# Patient Record
Sex: Female | Born: 1970 | Race: White | Hispanic: No | Marital: Married | Smoking: Never smoker
Health system: Southern US, Community
[De-identification: ages and names within clinical notes are randomized; demographics above are authoritative.]

## PROBLEM LIST (undated history)

## (undated) DIAGNOSIS — Z1501 Genetic susceptibility to malignant neoplasm of breast: Secondary | ICD-10-CM

## (undated) HISTORY — PX: DIAGNOSTIC LAPAROSCOPY: SUR761

## (undated) HISTORY — PX: LAPAROSCOPIC ENDOMETRIOSIS FULGURATION: SUR769

## (undated) HISTORY — DX: Genetic susceptibility to malignant neoplasm of breast: Z15.01

## (undated) HISTORY — PX: TUBAL LIGATION: SHX77

---

## 1997-11-25 ENCOUNTER — Other Ambulatory Visit: Admission: RE | Admit: 1997-11-25 | Discharge: 1997-11-25 | Payer: Self-pay | Admitting: Obstetrics and Gynecology

## 1998-05-05 ENCOUNTER — Other Ambulatory Visit: Admission: RE | Admit: 1998-05-05 | Discharge: 1998-05-05 | Payer: Self-pay | Admitting: Obstetrics and Gynecology

## 1998-11-14 ENCOUNTER — Inpatient Hospital Stay (HOSPITAL_COMMUNITY): Admission: AD | Admit: 1998-11-14 | Discharge: 1998-11-14 | Payer: Self-pay | Admitting: Obstetrics and Gynecology

## 1999-01-25 ENCOUNTER — Other Ambulatory Visit: Admission: RE | Admit: 1999-01-25 | Discharge: 1999-01-25 | Payer: Self-pay | Admitting: Obstetrics and Gynecology

## 1999-10-18 ENCOUNTER — Other Ambulatory Visit: Admission: RE | Admit: 1999-10-18 | Discharge: 1999-10-18 | Payer: Self-pay | Admitting: Obstetrics and Gynecology

## 2000-05-04 ENCOUNTER — Inpatient Hospital Stay (HOSPITAL_COMMUNITY): Admission: AD | Admit: 2000-05-04 | Discharge: 2000-05-06 | Payer: Self-pay | Admitting: Obstetrics and Gynecology

## 2000-06-04 ENCOUNTER — Other Ambulatory Visit: Admission: RE | Admit: 2000-06-04 | Discharge: 2000-06-04 | Payer: Self-pay | Admitting: Obstetrics and Gynecology

## 2001-01-18 ENCOUNTER — Other Ambulatory Visit: Admission: RE | Admit: 2001-01-18 | Discharge: 2001-01-18 | Payer: Self-pay | Admitting: Obstetrics and Gynecology

## 2001-09-04 ENCOUNTER — Other Ambulatory Visit: Admission: RE | Admit: 2001-09-04 | Discharge: 2001-09-04 | Payer: Self-pay | Admitting: Obstetrics and Gynecology

## 2002-07-08 ENCOUNTER — Inpatient Hospital Stay (HOSPITAL_COMMUNITY): Admission: AD | Admit: 2002-07-08 | Discharge: 2002-07-10 | Payer: Self-pay | Admitting: Obstetrics and Gynecology

## 2002-08-26 ENCOUNTER — Other Ambulatory Visit: Admission: RE | Admit: 2002-08-26 | Discharge: 2002-08-26 | Payer: Self-pay | Admitting: Obstetrics and Gynecology

## 2003-05-15 ENCOUNTER — Other Ambulatory Visit: Admission: RE | Admit: 2003-05-15 | Discharge: 2003-05-15 | Payer: Self-pay | Admitting: Obstetrics and Gynecology

## 2004-03-15 ENCOUNTER — Ambulatory Visit: Payer: Self-pay | Admitting: Family Medicine

## 2005-03-30 ENCOUNTER — Ambulatory Visit: Payer: Self-pay | Admitting: Family Medicine

## 2006-04-18 ENCOUNTER — Ambulatory Visit: Payer: Self-pay | Admitting: Family Medicine

## 2006-05-02 ENCOUNTER — Ambulatory Visit: Payer: Self-pay | Admitting: Family Medicine

## 2006-05-02 LAB — CONVERTED CEMR LAB
ALT: 30 units/L (ref 0–40)
AST: 21 units/L (ref 0–37)
Alkaline Phosphatase: 75 units/L (ref 39–117)
BUN: 8 mg/dL (ref 6–23)
Basophils Relative: 0.3 % (ref 0.0–1.0)
CO2: 30 meq/L (ref 19–32)
Chloride: 108 meq/L (ref 96–112)
Creatinine, Ser: 0.6 mg/dL (ref 0.4–1.2)
Eosinophils Relative: 1.1 % (ref 0.0–5.0)
Glucose, Bld: 77 mg/dL (ref 70–99)
Lymphocytes Relative: 25.9 % (ref 12.0–46.0)
Monocytes Absolute: 0.5 10*3/uL (ref 0.2–0.7)
Monocytes Relative: 8.5 % (ref 3.0–11.0)
Platelets: 330 10*3/uL (ref 150–400)
Potassium: 4.3 meq/L (ref 3.5–5.1)
RDW: 11.1 % — ABNORMAL LOW (ref 11.5–14.6)
TSH: 1.11 microintl units/mL (ref 0.35–5.50)
Total Bilirubin: 0.6 mg/dL (ref 0.3–1.2)
Total Protein: 7.1 g/dL (ref 6.0–8.3)

## 2007-11-17 LAB — CONVERTED CEMR LAB: Pap Smear: NORMAL

## 2008-01-22 ENCOUNTER — Ambulatory Visit: Payer: Self-pay | Admitting: Family Medicine

## 2008-01-22 DIAGNOSIS — D485 Neoplasm of uncertain behavior of skin: Secondary | ICD-10-CM | POA: Insufficient documentation

## 2008-01-24 LAB — CONVERTED CEMR LAB
ALT: 25 units/L (ref 0–35)
Albumin: 4.4 g/dL (ref 3.5–5.2)
Basophils Relative: 0.7 % (ref 0.0–3.0)
Calcium: 9.9 mg/dL (ref 8.4–10.5)
Chloride: 105 meq/L (ref 96–112)
Cholesterol: 139 mg/dL (ref 0–200)
Eosinophils Absolute: 0 10*3/uL (ref 0.0–0.7)
GFR calc Af Amer: 145 mL/min
Glucose, Bld: 91 mg/dL (ref 70–99)
HCT: 40.7 % (ref 36.0–46.0)
HDL: 41.8 mg/dL (ref >39.0)
LDL Cholesterol: 87 mg/dL (ref 0–99)
MCHC: 36 g/dL (ref 30.0–36.0)
Monocytes Relative: 7.1 % (ref 3.0–12.0)
Potassium: 4 meq/L (ref 3.5–5.1)
RBC: 4.34 M/uL (ref 3.87–5.11)
Total CHOL/HDL Ratio: 3.3
VLDL: 11 mg/dL (ref 0–40)
WBC: 6.7 10*3/uL (ref 4.5–10.5)

## 2008-03-09 ENCOUNTER — Encounter: Payer: Self-pay | Admitting: Family Medicine

## 2008-03-17 ENCOUNTER — Encounter (INDEPENDENT_AMBULATORY_CARE_PROVIDER_SITE_OTHER): Payer: Self-pay | Admitting: *Deleted

## 2008-05-11 ENCOUNTER — Ambulatory Visit: Payer: Self-pay | Admitting: Family Medicine

## 2008-05-11 DIAGNOSIS — K644 Residual hemorrhoidal skin tags: Secondary | ICD-10-CM | POA: Insufficient documentation

## 2008-05-15 ENCOUNTER — Telehealth: Payer: Self-pay | Admitting: Family Medicine

## 2008-05-22 ENCOUNTER — Telehealth (INDEPENDENT_AMBULATORY_CARE_PROVIDER_SITE_OTHER): Payer: Self-pay | Admitting: *Deleted

## 2009-01-21 ENCOUNTER — Ambulatory Visit: Payer: Self-pay | Admitting: Family Medicine

## 2009-01-21 DIAGNOSIS — G4700 Insomnia, unspecified: Secondary | ICD-10-CM | POA: Insufficient documentation

## 2009-08-24 ENCOUNTER — Ambulatory Visit: Payer: Self-pay | Admitting: Family Medicine

## 2009-08-24 ENCOUNTER — Telehealth: Payer: Self-pay | Admitting: Family Medicine

## 2009-08-24 DIAGNOSIS — N390 Urinary tract infection, site not specified: Secondary | ICD-10-CM | POA: Insufficient documentation

## 2009-08-24 LAB — CONVERTED CEMR LAB
Casts: 0 /lpf
Glucose, Urine, Semiquant: NEGATIVE
Ketones, urine, test strip: NEGATIVE
Nitrite: POSITIVE
Protein, U semiquant: 30
Urobilinogen, UA: 0.2

## 2009-08-26 ENCOUNTER — Encounter: Payer: Self-pay | Admitting: Family Medicine

## 2009-09-13 ENCOUNTER — Ambulatory Visit: Payer: Self-pay | Admitting: Family Medicine

## 2009-09-13 DIAGNOSIS — M79609 Pain in unspecified limb: Secondary | ICD-10-CM | POA: Insufficient documentation

## 2009-09-13 LAB — CONVERTED CEMR LAB
Bilirubin Urine: NEGATIVE
Glucose, Urine, Semiquant: NEGATIVE
Urobilinogen, UA: 0.2
Yeast, UA: 0
pH: 6

## 2009-10-14 ENCOUNTER — Encounter: Admission: RE | Admit: 2009-10-14 | Discharge: 2009-10-14 | Payer: Self-pay | Admitting: Obstetrics and Gynecology

## 2009-10-16 ENCOUNTER — Ambulatory Visit (HOSPITAL_COMMUNITY): Admission: RE | Admit: 2009-10-16 | Discharge: 2009-10-16 | Payer: Self-pay | Admitting: Obstetrics and Gynecology

## 2010-02-17 NOTE — Assessment & Plan Note (Signed)
Summary: LEG PAIN,UTI/CLE   Vital Signs:  Patient profile:   40 year old female Height:      65.5 inches Weight:      153.50 pounds BMI:     25.25 Temp:     99 degrees F oral Pulse rate:   76 / minute Pulse rhythm:   regular BP sitting:   100 / 76  (left arm) Cuff size:   regular  Vitals Entered By: Lewanda Rife LPN (September 13, 2009 10:30 AM) CC: Left leg pain under lt knee, only hursts when touches the area and burning in perineal area   History of Present Illness: just had a uti -- ? wonders if never totally cleared  some burining  no frequency  no blood in urine   L leg problem is having sharp pain just under her knee-- when she presses on shin bone  going on for 4-5 weeks  worse when any pressure is put on it  walking and activity is just fine  not swollen  cannot feel a difference in that area when she palpates  no memory of trauma   has not run in 6-8 months    Allergies (verified): No Known Drug Allergies  Past History:  Past Medical History: Last updated: 01/21/2009 Endometriosis Depression (started as post partum) HPV merina iud   derm-  skin care   Past Surgical History: Last updated: 01/22/2008 Wisdom Teeth 2 NSVD Epl laparoscopy- endometriosis Leep Procedure  Family History: Last updated: 01/22/2008 Father- Lyme Disease, HTN  Grand mother- [place] Evette Cristal) @ 72  Social History: Last updated: 01/21/2009 Married Never Smoked Alcohol use-no Drug use-no Occupation: works Chief of Staff - auditor   Risk Factors: Smoking Status: never (01/22/2008)  Review of Systems General:  Denies chills, fatigue, fever, loss of appetite, and malaise. CV:  Denies chest pain or discomfort and palpitations. Resp:  Denies cough, shortness of breath, and wheezing. GI:  Denies nausea and vomiting. GU:  Complains of dysuria; denies hematuria. MS:  Denies joint redness, joint swelling, muscle aches, cramps, muscle weakness, and stiffness. Derm:  Denies  itching, lesion(s), poor wound healing, and rash. Neuro:  Denies numbness, tingling, and weakness. Heme:  Denies abnormal bruising and bleeding.  Physical Exam  General:  Well-developed,well-nourished,in no acute distress; alert,appropriate and cooperative throughout examination Head:  normocephalic, atraumatic, and no abnormalities observed.   Eyes:  vision grossly intact, pupils equal, pupils round, and pupils reactive to light.   Mouth:  pharynx pink and moist.   Neck:  supple with full rom and no masses or thyromegally, no JVD or carotid bruit  Lungs:  Normal respiratory effort, chest expands symmetrically. Lungs are clear to auscultation, no crackles or wheezes. Heart:  Normal rate and regular rhythm. S1 and S2 normal without gallop, murmur, click, rub or other extra sounds. Abdomen:  very mild suprapubic tenderness on deep palp only no rebound or gaurding Msk:  tender over tibia just inf to insertion of patellar tendon nl rom , no swelling or eff stable - nl drawer and lachman no crepitice   no CVA tenderness  Pulses:  R and L carotid,radial,femoral,dorsalis pedis and posterior tibial pulses are full and equal bilaterally Extremities:  No clubbing, cyanosis, edema, or deformity noted with normal full range of motion of all joints.   Neurologic:  strength normal in all extremities, sensation intact to light touch, gait normal, and DTRs symmetrical and normal.   Skin:  Intact without suspicious lesions or rashes Inguinal Nodes:  No  significant adenopathy Psych:  normal affect, talkative and pleasant    Impression & Recommendations:  Problem # 1:  UTI (ICD-599.0) Assessment Improved  ua is still slt pos  will tx with bactrim - for incomplete tx uti  rev cx  will avoid quinolone in light of leg pain (though the pain did pre date it) The following medications were removed from the medication list:    Cipro 250 Mg Tabs (Ciprofloxacin hcl) .Marland Kitchen... Take one tablet by mouth two  times a day for five days Her updated medication list for this problem includes:    Bactrim Ds 800-160 Mg Tabs (Sulfamethoxazole-trimethoprim) .Marland Kitchen... 1 by mouth two times a day for 5 days  Encouraged to push clear liquids, get enough rest, and take acetaminophen as needed. To be seen in 10 days if no improvement, sooner if worse.  Orders: Prescription Created Electronically [id number]) UA Dipstick W/ Micro (manual) ([address])  Problem # 2:  LEG PAIN (ICD-729.5) unusual leg tenderness in shin area without hx of trauma  disc poss of tendonitis vs bony etiol x ray and update T-Knee Left 2 view (73560TC) T-Tib/Fib Left (73590TC)  Complete Medication List: 1)  Proctofoam Hc 1-1 % Foam (Hydrocortisone ace-pramoxine) .... Apply to affected area 3-4 times daily as needed 2)  Silenor 6 Mg Tabs (Doxepin hcl) .... 1/2 to 1   by mouth at bedtime as needed insomnia 3)  Lexapro 5 Mg Tabs (Escitalopram oxalate) .... Take 1 tablet by mouth once a day 4)  Bactrim Ds 800-160 Mg Tabs (Sulfamethoxazole-trimethoprim) .Marland Kitchen.. 1 by mouth two times a day for 5 days  Patient Instructions: 1)  take the bactrim for uti -- and update me if symptoms do not improve 2)  x rays of knee and leg today I will update you with result  Prescriptions: BACTRIM DS 800-160 MG TABS (SULFAMETHOXAZOLE-TRIMETHOPRIM) 1 by mouth two times a day for 5 days  #10 x 0   Entered and Authorized by:   Judith Part MD   Signed by:   Judith Part MD on 09/13/2009   Method used:   [facility]        [facility] [id number]       [address]       [place], [place]  [address]       Ph: [phone] or [phone]       Fax: [phone]   RxID:   [id number]   Current Allergies (reviewed today): No known allergies   Laboratory Results   Urine Tests  Date/Time Received: September 13, 2009 10:32 AM  Date/Time Reported: September 13, 2009 10:32 AM   Routine Urinalysis   Color: yellow Appearance: Hazy Glucose:  negative   (Normal Range: Negative) Bilirubin: negative   (Normal Range: Negative) Ketone: negative   (Normal Range: Negative) Spec. Gravity: 1.010   (Normal Range: 1.003-1.035) Blood: trace-lysed   (Normal Range: Negative) pH: 6.0   (Normal Range: 5.0-8.0) Protein: trace   (Normal Range: Negative) Urobilinogen: 0.2   (Normal Range: 0-1) Nitrite: negative   (Normal Range: Negative) Leukocyte Esterace: small   (Normal Range: Negative)  Urine Microscopic WBC/HPF: 2-3 RBC/HPF: 0 Bacteria/HPF: few Mucous/HPF: few Epithelial/HPF: 1-2 Crystals/HPF: 0 Casts/LPF: 0 Yeast/HPF: 0 Other: 0

## 2010-02-17 NOTE — Progress Notes (Signed)
Summary: wants to leave urine sample  Phone Note Call from Patient Call back at Work Phone [phone]   Caller: Patient Call For: Judith Part MD Summary of Call: Pt thinks she has a UTI- has burning, pain, frequency.  She is going out of town on business tomorrow and she is asking if she can come in and leave urine sample today. Initial call taken by: Lowella Petties CMA,  August 24, 2009 8:08 AM  Follow-up for Phone Call        that is ok  when she gives urine - open up a nurse visit and send to me- thanks Follow-up by: Judith Part MD,  August 24, 2009 12:06 PM  Additional Follow-up for Phone Call Additional follow up Details #1::        Left message on machine at patient's job advising her to come in and leave a urine specimen, along with a phone number where she can be reached, and her pharmacy phone number.  Advised her to try not to come between 1-2 because the nurses are at lunch during that time.   Linde Gillis CMA Duncan Dull)  August 24, 2009 12:25 PM   Per Zella Ball, patient will be coming in right away to leave urine sample.  Please see nurse visit dated 08/24/2009 for further details. Additional Follow-up by: Linde Gillis CMA Duncan Dull),  August 24, 2009 12:36 PM

## 2010-02-17 NOTE — Assessment & Plan Note (Signed)
Summary: SLEEP ISSUES/[facility]H   Vital Signs:  Patient profile:   40 year old female Weight:      149 pounds Temp:     98.3 degrees F oral Pulse rate:   80 / minute Pulse rhythm:   regular BP sitting:   102 / 80  (left arm) Cuff size:   regular  Vitals Entered By: Lowella Petties CMA (January 21, 2009 11:47 AM) CC: Sleep problems- frequent waking up at night.   History of Present Illness: has terrible sleep habits for years  getting worse  wakes up at least every hour all night -- up to every 10 minutes is a very light sleeper lifelong (worse now with kids)  is tired all the time life is stressful and busy-- this is getting worse - getting through the day with fatigue  is not a snorer no sleep walking or talking  does remember all of her dreams   tried to cut back on caffiene  8 cans of diet coke daily in past - now is cutting way back  no coffee at all   is working on wt loss and inc water  is an am exercises very early in am -- this works well for her   during the week - goes to bed at 9 pm (reads usually 30 minutes and then falls asleep)  longest stretch is 10-12 -- then up frequently  always calculating how long it is until she wakes up  gets up 4:45 on weekdays  on weekends 7-8 am  goes to bed latest 10 on weekends   naps one sunday per month (for 1 hour)-- this is better now   off lexapro for a couple of years (did not help her sleep quality)- was on for PP depression  not depressed / but generally stressed/ anxious - and irritable   very seldom takes tylenol pm or benadryl -- this sedates her too much in am , and still wakes up more often      Allergies: No Known Drug Allergies  Past History:  Past Surgical History: Last updated: 01/22/2008 Wisdom Teeth 2 NSVD Epl laparoscopy- endometriosis Leep Procedure  Family History: Last updated: 01/22/2008 Father- Lyme Disease, HTN  Grand mother- [place] Evette Cristal) @ 60  Social History: Last updated:  01/21/2009 Married Never Smoked Alcohol use-no Drug use-no Occupation: works Chief of Staff - auditor   Risk Factors: Smoking Status: never (01/22/2008)  Past Medical History: Endometriosis Depression (started as post partum) HPV merina iud   derm- [place] [facility]   Social History: Married Never Smoked Alcohol use-no Drug use-no Occupation: works Chief of Staff - auditor   Review of Systems General:  Complains of fatigue and sleep disorder; denies sweats and weakness. Eyes:  Denies blurring and eye irritation. CV:  Denies chest pain or discomfort and palpitations. Resp:  Denies cough and wheezing. GU:  Denies urinary frequency. Derm:  Denies poor wound healing and rash. Neuro:  Denies headaches, numbness, tingling, and tremors. Psych:  Denies anxiety and depression. Endo:  Denies cold intolerance, excessive thirst, excessive urination, and heat intolerance.  Physical Exam  General:  Well-developed,well-nourished,in no acute distress; alert,appropriate and cooperative throughout examination Head:  normocephalic, atraumatic, and no abnormalities observed.   Mouth:  pharynx pink and moist.   Neck:  supple with full rom and no masses or thyromegally, no JVD or carotid bruit  Lungs:  Normal respiratory effort, chest expands symmetrically. Lungs are clear to auscultation, no crackles or wheezes. Heart:  Normal rate and regular rhythm. S1 and S2 normal without gallop, murmur, click, rub or other extra sounds. Extremities:  No clubbing, cyanosis, edema, or deformity noted with normal full range of motion of all joints.   Neurologic:  cranial nerves II-XII intact, sensation intact to light touch, gait normal, and DTRs symmetrical and normal.  no tremor  Skin:  Intact without suspicious lesions or rashes Cervical Nodes:  No lymphadenopathy noted Psych:  normal affect, talkative and pleasant  good eye contact and comm skills not seemingly anx or depressed    Impression  & Recommendations:  Problem # 1:  INSOMNIA, CHRONIC (ICD-307.42) Assessment New with light sleep and sleep latency problems  disc lifestyle change in detail and given handout from [facility]  sleep hygiene disc in detail / and how to keep a sleep log will cut caffine as well trial of silenor 6 mg and update   Complete Medication List: 1)  Proctofoam Hc 1-1 % Foam (Hydrocortisone ace-pramoxine) .... Apply to affected area 3-4 times daily 2)  Silenor 6 Mg Tabs (Doxepin hcl) .... 1/2 to 1   by mouth at bedtime as needed insomnia  Patient Instructions: 1)  try to go to bed and get up at the same time every day 2)  avoid naps  3)  cut caffine as much as possible  4)  keep up good water intake and exercise habits  5)  do not do anything in bed but sleep and light reading  6)  turn clock around or make it invisible  7)  try the silanor -- daily for 1-2 weeks than as needed  Prescriptions: PROCTOFOAM HC 1-1 % FOAM (HYDROCORTISONE ACE-PRAMOXINE) apply to affected area 3-4 times daily  #1 small x 2   Entered and Authorized by:   Judith Part MD   Signed by:   Judith Part MD on 01/21/2009   Method used:   [facility]        [facility] [facility]ail)       [address]       [place], [place]  [address]       Ph: [phone] or [phone]       Fax: [phone]   RxID:   [id number] SILENOR 6 MG TABS (DOXEPIN HCL) 1/2 to 1   by mouth at bedtime as needed insomnia  #30 x 2   Entered and Authorized by:   Judith Part MD   Signed by:   Judith Part MD on 01/21/2009   Method used:   [facility]        [facility] (retail)       [address]       [place], [place]  [address]       Ph: [phone] or [phone]       Fax: [phone]   RxID:   [id number] PROCTOFOAM HC 1-1 % FOAM (HYDROCORTISONE ACE-PRAMOXINE) apply to affected area 3-4 times daily  #1 small x 2   Entered and Authorized by:   Judith Part MD   Signed by:    Judith Part MD on 01/21/2009   Method used:   Print then Give to Patient   RxID:   [id number] SILENOR 6 MG TABS (DOXEPIN HCL) 1/2 to 1   by mouth at bedtime as needed insomnia  #30 x 2   Entered and Authorized by:   Judith Part MD   Signed by:   Colon Flattery  Hasina Kreager MD on 01/21/2009   Method used:   Print then Give to Patient   RxID:   [id number]   Prior Medications (reviewed today): PROCTOFOAM HC 1-1 % FOAM (HYDROCORTISONE ACE-PRAMOXINE) apply to affected area 3-4 times daily SILENOR 6 MG TABS (DOXEPIN HCL) 1/2 to 1   by mouth at bedtime as needed insomnia Current Allergies: No known allergies

## 2010-02-17 NOTE — Assessment & Plan Note (Signed)
Summary: URINE SAMPLE/[facility]   Pt came to the office at 5:21pm today, says pharmacy did not recd Rx.[facility], confirmed they did not recievied.Marland KitchenMarland KitchenGave Rx over the phone. Cdavis 08-24-2009   Medications Added CIPRO 250 MG TABS (CIPROFLOXACIN HCL) take one tablet by mouth two times a day for five days      Allergies Added: NKDA Nurse Visit   Allergies (verified): No Known Drug Allergies Laboratory Results   Urine Tests  Date/Time Received: August 24, 2009 12:49 PM   Routine Urinalysis   Color: yellow Appearance: Cloudy Glucose: negative   (Normal Range: Negative) Bilirubin: negative   (Normal Range: Negative) Ketone: negative   (Normal Range: Negative) Spec. Gravity: >=1.030   (Normal Range: 1.003-1.035) Blood: large   (Normal Range: Negative) pH: 5.0   (Normal Range: 5.0-8.0) Protein: 30   (Normal Range: Negative) Urobilinogen: 0.2   (Normal Range: 0-1) Nitrite: positive   (Normal Range: Negative) Leukocyte Esterace: large   (Normal Range: Negative)  Urine Microscopic WBC/HPF: many RBC/HPF: many Bacteria/HPF: many Mucous/HPF: few Epithelial/HPF: 1-3 Crystals/HPF: 0 Casts/LPF: 0 Yeast/HPF: 0 Other: 0    Comments: Patient's work # [phone], cell# [phone].  [facility] call in cipro 250mg  1 by mouth two times a day for 5 days #10 no ref  send for cx  update if not improved     Orders Added: 1)  Est. Patient Level I [44034] 2)  UA Dipstick w/o Micro (manual) [81002] 3)  T-Culture, Urine [[id number]] Prescriptions: CIPRO 250 MG TABS (CIPROFLOXACIN HCL) take one tablet by mouth two times a day for five days  #10 x 0   Entered by:   Linde Gillis CMA (AAMA)   Authorized by:   Judith Part MD   Signed by:   Daine Gip on 08/24/2009   Method used:   [facility]ronically to        [facility] (retail)       [address]       [place], [place]  [address]       Ph: [phone] or [phone]       Fax: [phone]  RxID:   [id number]   Current Allergies (reviewed today): No known allergies

## 2010-03-30 LAB — CBC
HCT: 41.5 % (ref 36.0–46.0)
MCHC: 34.2 g/dL (ref 30.0–36.0)
Platelets: 247 10*3/uL (ref 150–400)
RDW: 11.9 % (ref 11.5–15.5)

## 2010-03-30 LAB — SURGICAL PCR SCREEN: Staphylococcus aureus: NEGATIVE

## 2010-06-03 NOTE — H&P (Signed)
   NAME:  Elizabeth Sheppard, [name]                     ACCOUNT NO.:  [id number]   MEDICAL RECORD NO.:  [id number]                   PATIENT TYPE:  INP   LOCATION:  9161                                 FACILITY:  [facility]   PHYSICIAN:  Lenoard Aden, M.D.             DATE OF BIRTH:  1970/08/09   DATE OF ADMISSION:  07/08/2002  DATE OF DISCHARGE:                                HISTORY & PHYSICAL   CHIEF COMPLAINT:  Macrosomia, polyhydramnios.   HISTORY OF PRESENT ILLNESS:  The patient is a 40 year old white female G3,  P1, EDD July 23, 2002 at 38 weeks with presumed macrosomia and polyhydramnios  with an AFI of 26 who presents for induction.   ALLERGIES:  No known drug allergies.   MEDICATIONS:  Prenatal vitamins.   PAST OBSTETRICAL HISTORY:  History of TAB in 1995.  History of a 9 pound 3  ounce female in April 2002.   PAST MEDICAL HISTORY:  1. History of wisdom tooth surgery in 1993.  2. History of a laparoscopy 2001.   FAMILY HISTORY:  Prostate cancer, bipolar disorder, lupus, myocardial  infarction, hypertension.   PRENATAL LABORATORIES:  Blood type AB+.  Rubella immune.  Hepatitis and HIV  are negative.  Toxo titers are negative.   PHYSICAL EXAMINATION:  GENERAL:  She is a well-developed, well-nourished  white female in no acute distress.  HEENT:  Normal.  LUNGS:  Clear.  HEART:  Regular rate and rhythm.  ABDOMEN:  Soft, gravid, and nontender.  PELVIC:  Cervix is 2-3 cm, 2 cm long, vertex, -1.  EXTREMITIES:  No cords.  NEUROLOGIC:  Nonfocal.   IMPRESSION:  1. A 38-week intrauterine pregnancy.  2. Large for gestational age with polyhydramnios.   PLAN:  Proceed with induction.  Risks, benefits discussed.  Anticipated  attempt at vaginal delivery.                                              Lenoard Aden, M.D.   RJT/MEDQ  D:  07/08/2002  T:  07/08/2002  Job:  [id number]

## 2010-06-03 NOTE — H&P (Signed)
[facility]  Patient:    Elizabeth Sheppard, Elizabeth Sheppard                  MRN: [id number] Adm. Date:  [id number] Attending:  Lenoard Aden                         History and Physical  CHIEF COMPLAINT:              Macrosomia, polyhydramnios.  HISTORY OF PRESENT ILLNESS:   The patient is a 40 year old, white female, G2, P0, EDD May 10, 2000, at 39+ weeks, who presents for induction due to presumed macrosomia and polyhydramnios.  ALLERGIES:                    No known drug allergies.  MEDICATIONS:                  Prenatal vitamins.  OBSTETRICAL HISTORY:          TAB in 1995.  GYNECOLOGIC HISTORY:          Remarkable for LEEP in 1996 and laparoscopy in 2001.  History of infertility requiring Folstein and ECG with insemination done in August of 2001.  FAMILY HISTORY:               Remarkable for heart disease, skin cancer and schizophrenia.  SOCIAL HISTORY:               Noncontributory.  The patient denies domestic or physical violence.  PHYSICAL EXAMINATION:  GENERAL:                      On physical, examination she is a well-developed, well-nourished white female in no apparent distress.  HEENT:                        Normal.  LUNGS:                        Clear.  HEART:                        Regular rate and rhythm.  ABDOMEN:                      Soft, gravida, nontender.  Estimated fetal weight 8-1/2 to 9 pounds.  PELVIC:                       Cervix is 2 cm, 70%, vertex, -2.  EXTREMITIES:                  Reveal no cords.  NEUROLOGICAL:                 Examination is nonfocal.  IMPRESSION:                   1. Term intrauterine pregnancy, at 39 weeks with                                  presumed macrosomia, polyhydramnios for                                  induction.  2. History of loop electrosurgical excision                                  procedure.  PLAN:                         Proceed with  induction. DD:  05/04/00 TD:  05/04/00 Job: [id number]

## 2010-07-05 ENCOUNTER — Telehealth: Payer: Self-pay | Admitting: *Deleted

## 2010-07-05 NOTE — Telephone Encounter (Signed)
Please see note below.  Pt called to change her pharmacy to [facility] [phone], in [place], [place].

## 2010-07-05 NOTE — Telephone Encounter (Signed)
Left message on machine for patient to call back.

## 2010-07-05 NOTE — Telephone Encounter (Signed)
Pt states she is working out of town in [place], [place] and she has a UTI- frequency, pressure, pain.  She bought test strips from the store and it showed leuks. She is requesting that an antibiotic be [facility] there, she wont be back in town until Friday.  I advised her that antibiotics are not prescribed without an office visit and she may need to go to urgent care at her location, but she wanted me to ask you.  [facility] phone number is [phone].

## 2010-07-05 NOTE — Telephone Encounter (Signed)
Agree with above- cannot treat over the phone - she will have to find an urgent care center where she is thanks

## 2010-07-05 NOTE — Telephone Encounter (Signed)
Patient notified as instructed by telephone. Was informed by patient that she went to an urgent care and was given an antibiotic for a UTI.

## 2011-02-27 ENCOUNTER — Ambulatory Visit: Payer: Self-pay | Admitting: Family Medicine

## 2011-03-07 ENCOUNTER — Encounter: Payer: Self-pay | Admitting: *Deleted

## 2011-03-09 ENCOUNTER — Encounter: Payer: Self-pay | Admitting: Family Medicine

## 2011-09-11 ENCOUNTER — Other Ambulatory Visit: Payer: Self-pay | Admitting: Family Medicine

## 2011-09-11 NOTE — Telephone Encounter (Signed)
Patient informed. 

## 2011-09-11 NOTE — Telephone Encounter (Signed)
Request for Proctofoam HC rectal foam. No OV. Rx is not on medication list.ok to fill?

## 2011-09-11 NOTE — Telephone Encounter (Signed)
Will refil electronically times one  Please ask her to f/u if not improved

## 2011-09-15 ENCOUNTER — Telehealth: Payer: Self-pay | Admitting: Family Medicine

## 2011-09-15 MED ORDER — HYDROCORTISONE ACE-PRAMOXINE 2.5-1 % RE CREA: TOPICAL_CREAM | Freq: Two times a day (BID) | RECTAL | Status: AC

## 2011-09-15 NOTE — Telephone Encounter (Signed)
LMOVM of patient's cell and home phones.

## 2011-09-15 NOTE — Telephone Encounter (Signed)
Caller: Kessie/Patient; Patient Name: Elizabeth Sheppard; PCP: Roxy Manns [facility]); Best Callback Phone Number: [phone].  Call regarding Hemorrhoid not improving after Proctofoam RX.  Patient requesting Analpram script, due to good results in the past. Patient unable to come in for appointment on 8-30 due to leaving town for vacation.  All emergent symptoms ruled out per Rectal Symptoms protocol, see in 72 hours.  [facility] [address], [phone].

## 2011-09-15 NOTE — Telephone Encounter (Signed)
Try the analpram and f/u if no improved  Will refill electronically

## 2011-12-18 ENCOUNTER — Telehealth: Payer: Self-pay

## 2011-12-18 NOTE — Telephone Encounter (Signed)
Pt having external hemorrhoids again and sleeping issues. Pt scheduled appt to see Dr Milinda Antis 02/19/11 at 10:15 am.

## 2011-12-18 NOTE — Telephone Encounter (Signed)
Will see pt then.  

## 2011-12-19 ENCOUNTER — Ambulatory Visit (INDEPENDENT_AMBULATORY_CARE_PROVIDER_SITE_OTHER): Payer: Federal, State, Local not specified - PPO | Admitting: Family Medicine

## 2011-12-19 ENCOUNTER — Encounter: Payer: Self-pay | Admitting: Family Medicine

## 2011-12-19 VITALS — BP 106/64 | HR 72 | Temp 98.8°F | Ht 65.5 in | Wt 144.8 lb

## 2011-12-19 DIAGNOSIS — Z23 Encounter for immunization: Secondary | ICD-10-CM

## 2011-12-19 DIAGNOSIS — F5104 Psychophysiologic insomnia: Secondary | ICD-10-CM | POA: Insufficient documentation

## 2011-12-19 DIAGNOSIS — G4700 Insomnia, unspecified: Secondary | ICD-10-CM | POA: Insufficient documentation

## 2011-12-19 DIAGNOSIS — K644 Residual hemorrhoidal skin tags: Secondary | ICD-10-CM

## 2011-12-19 MED ORDER — ZOLPIDEM TARTRATE ER 12.5 MG PO TBCR: 12.5000 mg | EXTENDED_RELEASE_TABLET | Freq: Every evening | ORAL | Status: DC | PRN

## 2011-12-19 MED ORDER — HYDROCORTISONE ACE-PRAMOXINE 2.5-1 % RE CREA: TOPICAL_CREAM | Freq: Four times a day (QID) | RECTAL | Status: DC

## 2011-12-19 NOTE — Progress Notes (Signed)
Subjective:    Patient ID: Elizabeth Sheppard, female    DOB: January 17, 1970, 41 y.o.   MRN: [id number]  HPI Here for hemorrhoid problems and sleep problems   Hemorrhoids are terrible  New one for several months  Using analpram cream-not helping much  Is having a hard time cleaning  Is external -- and painful - makes it hard to do her running Generally does not strain - for BM or otherwise  No hard or difficult to pass stools  ? If she needs surgery   Is also having sleep problems Had this all her life  Wakes up all night long  Has tried silenor- did not keep her asleep- and too drowsy in the am  Tried a friend's Remus Loffler- it worked very well (thinks she would need extended release)  Does not snore-no symptoms of apnea   Goes to bed at 9 , gets up at 4:30 Falls asleep easily and then wakes up fast   No depression or anxiety   No caffeine after lunch - and runs a lot  Is trying to follow lifestyle change   She just had full lab work up at [facility]- nl thyroid/ chol and other wellness labs - this was reassuring    Patient Active Problem List  Diagnosis  . NEOPLASM OF UNCERTAIN BEHAVIOR OF SKIN  . INSOMNIA, CHRONIC  . EXTERNAL HEMORRHOIDS  . UTI  . LEG PAIN   No past medical history on file. No past surgical history on file. History  Substance Use Topics  . Smoking status: Never Smoker   . Smokeless tobacco: Not on file  . Alcohol Use: Yes     Comment: occasional wine   No family history on file. No Known Allergies No current outpatient prescriptions on file prior to visit.      Review of Systems Review of Systems  Constitutional: Negative for fever, appetite change, fatigue and unexpected weight change.  Eyes: Negative for pain and visual disturbance.  Respiratory: Negative for cough and shortness of breath.   Cardiovascular: Negative for cp or palpitations    Gastrointestinal: Negative for nausea, diarrhea and constipation. pos for painful hemorrhoids without  bleeding  Genitourinary: Negative for urgency and frequency.  Skin: Negative for pallor or rash   Neurological: Negative for weakness, light-headedness, numbness and headaches. pos for sleep problems  Hematological: Negative for adenopathy. Does not bruise/bleed easily.  Psychiatric/Behavioral: Negative for dysphoric mood. The patient is not nervous/anxious.         Objective:   Physical Exam  Constitutional: She appears well-developed and well-nourished. No distress.  HENT:  Head: Normocephalic and atraumatic.  Mouth/Throat: Oropharynx is clear and moist.  Eyes: Conjunctivae normal and EOM are normal. Pupils are equal, round, and reactive to light.  Neck: Normal range of motion. Neck supple.  Cardiovascular: Normal rate and regular rhythm.   Pulmonary/Chest: Effort normal and breath sounds normal.  Abdominal: Soft. Bowel sounds are normal. She exhibits no distension and no mass. There is no tenderness.  Genitourinary:       External hemorrhoid noted at 3:00- is erythematous and not thrombosed Is tender   Musculoskeletal: She exhibits no edema.  Lymphadenopathy:    She has no cervical adenopathy.  Neurological: She is alert. She has normal reflexes. She displays no tremor. No cranial nerve deficit. She exhibits normal muscle tone. Coordination normal.  Skin: Skin is warm and dry. No rash noted. No erythema. No pallor.  Psychiatric: She has a normal mood and affect.  Assessment & Plan:

## 2011-12-19 NOTE — Assessment & Plan Note (Signed)
Lifelong- worse lately with inability to stay asleep Failed silenor Will try ambien cr with caution Disc habit forming potential in detail

## 2011-12-19 NOTE — Assessment & Plan Note (Addendum)
No imp with analpram hc Avoids straining but is a runner  Pt is interested in surgical eval as this is recurrent  Disc symptomatic care - see instructions on AVS  surg ref done

## 2011-12-19 NOTE — Patient Instructions (Addendum)
You can use the cream - as often as 4 times daily Avoid straining and try sitz bath or cold compress We will do surgical referral at check out  Cut back diet coke to one daily  Try the ambien cr and let me know if any problems Flu shot today

## 2011-12-21 ENCOUNTER — Ambulatory Visit (INDEPENDENT_AMBULATORY_CARE_PROVIDER_SITE_OTHER): Payer: Federal, State, Local not specified - PPO | Admitting: General Surgery

## 2011-12-21 ENCOUNTER — Encounter (INDEPENDENT_AMBULATORY_CARE_PROVIDER_SITE_OTHER): Payer: Self-pay | Admitting: General Surgery

## 2011-12-21 VITALS — BP 100/62 | HR 68 | Temp 97.6°F | Resp 16 | Ht 65.0 in | Wt 144.4 lb

## 2011-12-21 DIAGNOSIS — K644 Residual hemorrhoidal skin tags: Secondary | ICD-10-CM

## 2011-12-21 MED ORDER — LIDOCAINE HCL 2 % EX GEL: CUTANEOUS | Status: AC

## 2011-12-21 MED ORDER — HYDROCORTISONE ACETATE 25 MG RE SUPP: 25.0000 mg | Freq: Two times a day (BID) | RECTAL | Status: DC

## 2011-12-21 NOTE — Progress Notes (Signed)
Subjective:     Patient ID: Elizabeth Sheppard, female   DOB: 13-Dec-1970, 41 y.o.   MRN: [id number]  HPI Who is referred by Dr. Milinda Antis for external hemorrhoids.  The patient states she's had hemorrhoids after childbirth approximately 12 years ago. She stated they do become irritated and do interfere with her activities. Patient states she has a high-fiber diet as well as his ease With bowel movements. She does not have any bleeding with bowel movements.  Review of Systems     Objective:   Physical Exam     Assessment:     3 external hemorrhoids, no thrombosis.    Plan:     1. I will prescribe the patient Anusol suppositories, lidocaine jelly,  psyillum fiber tabs 2.we will have the patient returned 2 weeks to assess her hemorrhoid issues. At that time they will discuss her hemorrhoid issues to discuss the possibility of going to the operating room for excision of external hemorrhoids.

## 2011-12-27 ENCOUNTER — Ambulatory Visit (INDEPENDENT_AMBULATORY_CARE_PROVIDER_SITE_OTHER): Payer: Self-pay | Admitting: General Surgery

## 2012-01-05 ENCOUNTER — Telehealth: Payer: Self-pay | Admitting: Family Medicine

## 2012-01-05 ENCOUNTER — Encounter (INDEPENDENT_AMBULATORY_CARE_PROVIDER_SITE_OTHER): Payer: Federal, State, Local not specified - PPO | Admitting: General Surgery

## 2012-01-05 MED ORDER — OSELTAMIVIR PHOSPHATE 75 MG PO CAPS: 75.0000 mg | ORAL_CAPSULE | Freq: Every day | ORAL | Status: DC

## 2012-01-05 NOTE — Telephone Encounter (Signed)
Pt notified and said she is going to pick it up tomorrow

## 2012-01-05 NOTE — Telephone Encounter (Signed)
Sent electronically to the pharmacy

## 2012-01-05 NOTE — Telephone Encounter (Signed)
Caller/Elizabeth Sheppard Phone: [phone] Called to ask for prescription for Tamiflu, in case becomes symptomatic over holidays.  Son in home diagnosed with Influenza; started on Tamiflu.  No current symptoms.  Informed  RX is not indicated for healthy patients without symptoms; exceptions are high risk patients and MD may consider on per case basis per  MD protocol.  Denies high risk history.   Agreed to call back, if symptomatic.

## 2012-01-05 NOTE — Telephone Encounter (Signed)
Pt agrees to start the Tamiflu, [facility] [address]

## 2012-01-05 NOTE — Telephone Encounter (Signed)
I would like her to take prophylactic dose -- so instead of waiting to get symptoms now she can take it now to prevent getting the flu See if she is agreeable and what pharmacy  Thanks

## 2012-02-14 ENCOUNTER — Telehealth: Payer: Self-pay | Admitting: *Deleted

## 2012-02-14 NOTE — Telephone Encounter (Signed)
Received prior auth request for pt's Ambien ER 12.5 mg tabs. Med approved from 12/15/2011 to 02/13/2013

## 2012-05-02 ENCOUNTER — Ambulatory Visit: Payer: Self-pay | Admitting: Obstetrics and Gynecology

## 2012-05-06 ENCOUNTER — Encounter: Payer: Self-pay | Admitting: Family Medicine

## 2012-05-07 ENCOUNTER — Encounter: Payer: Self-pay | Admitting: *Deleted

## 2012-06-25 ENCOUNTER — Other Ambulatory Visit: Payer: Self-pay | Admitting: Family Medicine

## 2012-06-26 NOTE — Telephone Encounter (Signed)
Rx called in as prescribed 

## 2012-06-26 NOTE — Telephone Encounter (Signed)
Px written for call in   

## 2012-12-27 ENCOUNTER — Other Ambulatory Visit: Payer: Self-pay | Admitting: Family Medicine

## 2012-12-29 NOTE — Telephone Encounter (Signed)
Please schedule for f/u this winter  Px written for call in

## 2012-12-31 ENCOUNTER — Other Ambulatory Visit: Payer: Self-pay | Admitting: Family Medicine

## 2012-12-31 NOTE — Telephone Encounter (Signed)
Left voicemail requesting pt to call office 

## 2013-01-01 NOTE — Telephone Encounter (Signed)
Pt is due for a f/u before refill given, I have left pt messages requesting pt to call office, addressed on prev. Refill request

## 2013-01-01 NOTE — Telephone Encounter (Signed)
F/u appt scheduled and Rx called in as prescribed

## 2013-01-01 NOTE — Telephone Encounter (Signed)
This was refilled several days ago I think

## 2013-01-01 NOTE — Telephone Encounter (Signed)
Pt requesting refill of medication. Last seen 12/2011. No future appts scheduled at this time. pls advise

## 2013-01-20 ENCOUNTER — Encounter: Payer: Self-pay | Admitting: Radiology

## 2013-01-21 ENCOUNTER — Ambulatory Visit (INDEPENDENT_AMBULATORY_CARE_PROVIDER_SITE_OTHER): Payer: Federal, State, Local not specified - PPO | Admitting: Family Medicine

## 2013-01-21 ENCOUNTER — Encounter: Payer: Self-pay | Admitting: Family Medicine

## 2013-01-21 VITALS — BP 110/66 | HR 73 | Temp 98.5°F | Ht 65.0 in | Wt 160.0 lb

## 2013-01-21 DIAGNOSIS — F419 Anxiety disorder, unspecified: Secondary | ICD-10-CM | POA: Insufficient documentation

## 2013-01-21 DIAGNOSIS — Z23 Encounter for immunization: Secondary | ICD-10-CM

## 2013-01-21 DIAGNOSIS — F5104 Psychophysiologic insomnia: Secondary | ICD-10-CM

## 2013-01-21 DIAGNOSIS — F411 Generalized anxiety disorder: Secondary | ICD-10-CM

## 2013-01-21 DIAGNOSIS — G4700 Insomnia, unspecified: Secondary | ICD-10-CM

## 2013-01-21 NOTE — Progress Notes (Signed)
Subjective:    Patient ID: Elizabeth Sheppard, female    DOB: 18-Aug-1970, 43 y.o.   MRN: [id number]  HPI Here for f/u of chronic medical problems and insomnia   She still has sleep disorder - and on ambien 12.5 - still only gets 4-5 hours of sleep She cut out caffeine  Has done all the sleep hygeine measures  Some exercise - but too busy lately  She is interested in seeing a sleep specialist   She really hates taking the ambien  Is chronically sleep deprived and exhausted   On lexapro now - this has helped her mood - for anxiety    (no longer on xanax) Dr Edwyna Ready px that  Does not affect her sleep   Good lifestyle habits   Did order some essential oils   Flu vaccine today  Had a full lab panel with gyn within the past year incl thyroid   Patient Active Problem List   Diagnosis Date Noted  . Chronic insomnia 12/19/2011  . LEG PAIN 09/13/2009  . INSOMNIA, CHRONIC 01/21/2009  . EXTERNAL HEMORRHOIDS 05/11/2008  . NEOPLASM OF UNCERTAIN BEHAVIOR OF SKIN 01/22/2008   No past medical history on file. Past Surgical History  Procedure Laterality Date  . Tubal ligation    . Laparoscopic endometriosis fulguration     History  Substance Use Topics  . Smoking status: Never Smoker   . Smokeless tobacco: Not on file  . Alcohol Use: Yes     Comment: occasional wine   Family History  Problem Relation Age of Onset  . Cancer Maternal Grandmother     skin cancer on the vagina  . Cancer Paternal Grandfather     prostate   No Known Allergies Current Outpatient Prescriptions on File Prior to Visit  Medication Sig Dispense Refill  . zolpidem (AMBIEN CR) 12.5 MG CR tablet TAKE 1 TABLET BY MOUTH AT BEDTIME AS NEEDED FOR SLEEP  30 tablet  1  . hydrocortisone (ANUSOL-HC) 25 MG suppository Place 1 suppository (25 mg total) rectally 2 (two) times daily.  12 suppository  0  . hydrocortisone-pramoxine (ANALPRAM-HC) 2.5-1 % rectal cream Place rectally 4 (four) times daily. On affected  area  30 g  1   No current facility-administered medications on file prior to visit.    Review of Systems Review of Systems  Constitutional: Negative for fever, appetite change,and unexpected weight change. pos for fatigue  Eyes: Negative for pain and visual disturbance.  Respiratory: Negative for cough and shortness of breath.   Cardiovascular: Negative for cp or palpitations    Gastrointestinal: Negative for nausea, diarrhea and constipation.  Genitourinary: Negative for urgency and frequency.  Skin: Negative for pallor or rash   Neurological: Negative for weakness, light-headedness, numbness and headaches.  Hematological: Negative for adenopathy. Does not bruise/bleed easily.  Psychiatric/Behavioral: Negative for dysphoric mood. The patient is  nervous/anxious.         Objective:   Physical Exam  Constitutional: She appears well-developed and well-nourished. No distress.  HENT:  Head: Normocephalic and atraumatic.  Mouth/Throat: Oropharynx is clear and moist.  Eyes: Conjunctivae and EOM are normal. Pupils are equal, round, and reactive to light. No scleral icterus.  Neck: Normal range of motion. Neck supple. No thyromegaly present.  Cardiovascular: Normal rate and regular rhythm.   Pulmonary/Chest: Effort normal and breath sounds normal.  Musculoskeletal: She exhibits no edema.  Lymphadenopathy:    She has no cervical adenopathy.  Neurological: She is alert. She has normal  reflexes. She displays no tremor. No cranial nerve deficit. She exhibits normal muscle tone. Coordination and gait normal.  Skin: Skin is warm and dry. No rash noted. No erythema. No pallor.  Psychiatric: She has a normal mood and affect.  Pt seems fatigued and stressed but in good spirits           Assessment & Plan:

## 2013-01-21 NOTE — Progress Notes (Signed)
Pre-visit discussion using our clinic review tool. No additional management support is needed unless otherwise documented below in the visit note.  

## 2013-01-21 NOTE — Patient Instructions (Signed)
Please stop up front for referral to neurologist  Take care of yourself  Please send for last labs from Dr Edwyna Ready  Hurman Horn for now as needed

## 2013-01-23 NOTE — Assessment & Plan Note (Signed)
Ongoing-on lexapro with stressors Reviewed stressors/ coping techniques/symptoms/ support sources/ tx options and side effects in detail today  This may be adding to her sleep issues  Offered counseling at any time

## 2013-01-23 NOTE — Assessment & Plan Note (Signed)
Pt has failed sleep hygeine / tx of anxiety/ and several medications  Currently uses ambien prn and does not like it  Ref to sleep specialist within [facility] for further eval

## 2013-02-13 ENCOUNTER — Encounter: Payer: Self-pay | Admitting: Family Medicine

## 2013-07-07 ENCOUNTER — Ambulatory Visit: Payer: Self-pay | Admitting: Obstetrics and Gynecology

## 2013-08-27 ENCOUNTER — Ambulatory Visit (INDEPENDENT_AMBULATORY_CARE_PROVIDER_SITE_OTHER): Payer: Federal, State, Local not specified - PPO | Admitting: Family Medicine

## 2013-08-27 ENCOUNTER — Encounter: Payer: Self-pay | Admitting: Family Medicine

## 2013-08-27 VITALS — BP 116/76 | HR 71 | Temp 98.0°F | Ht 65.0 in | Wt 166.8 lb

## 2013-08-27 DIAGNOSIS — R300 Dysuria: Secondary | ICD-10-CM

## 2013-08-27 DIAGNOSIS — N3000 Acute cystitis without hematuria: Secondary | ICD-10-CM

## 2013-08-27 DIAGNOSIS — N390 Urinary tract infection, site not specified: Secondary | ICD-10-CM | POA: Insufficient documentation

## 2013-08-27 LAB — POCT URINALYSIS DIPSTICK
Spec Grav, UA: 1.01
pH, UA: 6

## 2013-08-27 MED ORDER — CIPROFLOXACIN HCL 250 MG PO TABS: 250.0000 mg | ORAL_TABLET | Freq: Two times a day (BID) | ORAL | Status: DC

## 2013-08-27 NOTE — Patient Instructions (Addendum)
Drink water  Take cipro as directed Update if not starting to improve in a week or if worsening    Urinary Tract Infection Urinary tract infections (UTIs) can develop anywhere along your urinary tract. Your urinary tract is your body's drainage system for removing wastes and extra water. Your urinary tract includes two kidneys, two ureters, a bladder, and a urethra. Your kidneys are a pair of bean-shaped organs. Each kidney is about the size of your fist. They are located below your ribs, one on each side of your spine. CAUSES Infections are caused by microbes, which are microscopic organisms, including fungi, viruses, and bacteria. These organisms are so small that they can only be seen through a microscope. Bacteria are the microbes that most commonly cause UTIs. SYMPTOMS  Symptoms of UTIs may vary by age and gender of the patient and by the location of the infection. Symptoms in young women typically include a frequent and intense urge to urinate and a painful, burning feeling in the bladder or urethra during urination. Older women and men are more likely to be tired, shaky, and weak and have muscle aches and abdominal pain. A fever may mean the infection is in your kidneys. Other symptoms of a kidney infection include pain in your back or sides below the ribs, nausea, and vomiting. DIAGNOSIS To diagnose a UTI, your caregiver will ask you about your symptoms. Your caregiver also will ask to provide a urine sample. The urine sample will be tested for bacteria and white blood cells. White blood cells are made by your body to help fight infection. TREATMENT  Typically, UTIs can be treated with medication. Because most UTIs are caused by a bacterial infection, they usually can be treated with the use of antibiotics. The choice of antibiotic and length of treatment depend on your symptoms and the type of bacteria causing your infection. HOME CARE INSTRUCTIONS  If you were prescribed antibiotics, take  them exactly as your caregiver instructs you. Finish the medication even if you feel better after you have only taken some of the medication.  Drink enough water and fluids to keep your urine clear or pale yellow.  Avoid caffeine, tea, and carbonated beverages. They tend to irritate your bladder.  Empty your bladder often. Avoid holding urine for long periods of time.  Empty your bladder before and after sexual intercourse.  After a bowel movement, women should cleanse from front to back. Use each tissue only once. SEEK MEDICAL CARE IF:   You have back pain.  You develop a fever.  Your symptoms do not begin to resolve within 3 days. SEEK IMMEDIATE MEDICAL CARE IF:   You have severe back pain or lower abdominal pain.  You develop chills.  You have nausea or vomiting.  You have continued burning or discomfort with urination. MAKE SURE YOU:   Understand these instructions.  Will watch your condition.  Will get help right away if you are not doing well or get worse. Document Released: 10/12/2004 Document Revised: 07/04/2011 Document Reviewed: 02/10/2011 [facility] Patient Information 2015 [place], [place]. This information is not intended to replace advice given to you by your health care provider. Make sure you discuss any questions you have with your health care provider.

## 2013-08-27 NOTE — Progress Notes (Signed)
Pre visit review using our clinic review tool, if applicable. No additional management support is needed unless otherwise documented below in the visit note. 

## 2013-08-27 NOTE — Progress Notes (Signed)
Subjective:    Patient ID: Elizabeth Sheppard, female    DOB: April 27, 1970, 43 y.o.   MRN: [id number]  HPI Here with symptoms of uti  6 am began Urine frequency and urgency and burning  No blood in urine  No nausea or vomiting or fever   Has not changed diet  Knows she does not drink enough water    Took AZO - and urine is discolored Pos micro today   Patient Active Problem List   Diagnosis Date Noted  . Anxiety 01/21/2013  . Chronic insomnia 12/19/2011  . LEG PAIN 09/13/2009  . INSOMNIA, CHRONIC 01/21/2009  . EXTERNAL HEMORRHOIDS 05/11/2008  . NEOPLASM OF UNCERTAIN BEHAVIOR OF SKIN 01/22/2008   No past medical history on file. Past Surgical History  Procedure Laterality Date  . Tubal ligation    . Laparoscopic endometriosis fulguration     History  Substance Use Topics  . Smoking status: Never Smoker   . Smokeless tobacco: Not on file  . Alcohol Use: Yes     Comment: occasional wine   Family History  Problem Relation Age of Onset  . Cancer Maternal Grandmother     skin cancer on the vagina  . Cancer Paternal Grandfather     prostate   No Known Allergies Current Outpatient Prescriptions on File Prior to Visit  Medication Sig Dispense Refill  . zolpidem (AMBIEN CR) 12.5 MG CR tablet TAKE 1 TABLET BY MOUTH AT BEDTIME AS NEEDED FOR SLEEP  30 tablet  1   No current facility-administered medications on file prior to visit.    Review of Systems Review of Systems  Constitutional: Negative for fever, appetite change, fatigue and unexpected weight change.  Eyes: Negative for pain and visual disturbance.  Respiratory: Negative for cough and shortness of breath.   Cardiovascular: Negative for cp or palpitations    Gastrointestinal: Negative for nausea, diarrhea and constipation.  Genitourinary: pos  for urgency and frequency. neg for flank pain or hematuria  Skin: Negative for pallor or rash   Neurological: Negative for weakness, light-headedness, numbness and  headaches.  Hematological: Negative for adenopathy. Does not bruise/bleed easily.  Psychiatric/Behavioral: Negative for dysphoric mood. The patient is not nervous/anxious.         Objective:   Physical Exam  Constitutional: She appears well-developed and well-nourished. No distress.  HENT:  Head: Normocephalic and atraumatic.  Eyes: Conjunctivae and EOM are normal. Pupils are equal, round, and reactive to light.  Neck: Normal range of motion. Neck supple.  Cardiovascular: Normal rate and regular rhythm.   Pulmonary/Chest: Effort normal and breath sounds normal.  Abdominal: Soft. Bowel sounds are normal. She exhibits no distension and no mass. There is tenderness in the suprapubic area. There is no CVA tenderness.  Lymphadenopathy:    She has no cervical adenopathy.  Neurological: She is alert.  Skin: Skin is warm and dry. No rash noted.  Psychiatric: She has a normal mood and affect.          Assessment & Plan:   Problem List Items Addressed This Visit     Genitourinary   UTI (urinary tract infection)     Uncomplicated tx with cipro 250 mg bid for 5 d Azo for 2 d max for symptoms Inc water Disc ways to avoid utis Handout given Will update if not imp in 2-3 d or if fever or new symptoms      Other Visit Diagnoses   Dysuria    -  Primary  Relevant Orders       POCT urinalysis dipstick (Completed)

## 2013-08-27 NOTE — Assessment & Plan Note (Signed)
Uncomplicated tx with cipro 250 mg bid for 5 d Azo for 2 d max for symptoms Inc water Disc ways to avoid utis Handout given Will update if not imp in 2-3 d or if fever or new symptoms

## 2013-08-29 ENCOUNTER — Telehealth: Payer: Self-pay | Admitting: Family Medicine

## 2013-08-29 DIAGNOSIS — N390 Urinary tract infection, site not specified: Secondary | ICD-10-CM

## 2013-08-29 MED ORDER — SULFAMETHOXAZOLE-TMP DS 800-160 MG PO TABS
1.0000 | ORAL_TABLET | Freq: Two times a day (BID) | ORAL | Status: DC
Start: 2013-08-29 — End: 2014-02-18

## 2013-08-29 NOTE — Telephone Encounter (Signed)
I would like to send a culture when she can leave a specimen I will change her antibiotic - let me know what pharmacy

## 2013-08-29 NOTE — Telephone Encounter (Signed)
Pt notified Rx sent to pharmacy and pt will stop by office to leave a urine sample for culture

## 2013-08-29 NOTE — Telephone Encounter (Signed)
Will change to septra

## 2013-08-29 NOTE — Telephone Encounter (Signed)
Pt dropped of urine sample, urine cx ordered per Dr. Glori Bickers

## 2013-08-29 NOTE — Telephone Encounter (Signed)
Pt was seen Wed for UTI and pt states she is not feeling any better. She doesn't know if the cipro is not strong enough but she is concerned with the symptoms as she is leaving to go out of town around noon.  [facility] [place] [facility] [address] since she is working from home

## 2013-09-01 LAB — URINE CULTURE

## 2014-02-18 ENCOUNTER — Encounter: Payer: Self-pay | Admitting: Internal Medicine

## 2014-02-18 ENCOUNTER — Ambulatory Visit (INDEPENDENT_AMBULATORY_CARE_PROVIDER_SITE_OTHER): Payer: Federal, State, Local not specified - PPO | Admitting: Internal Medicine

## 2014-02-18 VITALS — BP 114/70 | HR 73 | Temp 98.0°F | Wt 161.0 lb

## 2014-02-18 DIAGNOSIS — Z23 Encounter for immunization: Secondary | ICD-10-CM

## 2014-02-18 DIAGNOSIS — R350 Frequency of micturition: Secondary | ICD-10-CM

## 2014-02-18 DIAGNOSIS — IMO0001 Reserved for inherently not codable concepts without codable children: Secondary | ICD-10-CM

## 2014-02-18 DIAGNOSIS — R3915 Urgency of urination: Secondary | ICD-10-CM

## 2014-02-18 DIAGNOSIS — R300 Dysuria: Secondary | ICD-10-CM

## 2014-02-18 LAB — POCT URINALYSIS DIPSTICK
BILIRUBIN UA: NEGATIVE
Glucose, UA: NEGATIVE
KETONES UA: NEGATIVE
Nitrite, UA: NEGATIVE
PH UA: 6
Protein, UA: NEGATIVE
RBC UA: NEGATIVE
Spec Grav, UA: <=1.005
Urobilinogen, UA: NEGATIVE

## 2014-02-18 MED ORDER — SULFAMETHOXAZOLE-TRIMETHOPRIM 800-160 MG PO TABS: 1.0000 | ORAL_TABLET | Freq: Two times a day (BID) | ORAL | Status: DC

## 2014-02-18 NOTE — Patient Instructions (Signed)

## 2014-02-18 NOTE — Progress Notes (Signed)
HPI  Pt presents to the clinic today with c/o urgency, frequency and dysuria. She reports that her symptoms started  2 days ago. She denies fever, chills, nausea or low back pain. She has tried AZO without any relief. She has had UTI's in the past, most recent one was 08/2013. She reports Cipro does not work for her. Her LMP was 2 days ago.   Review of Systems  History reviewed. No pertinent past medical history.  Family History  Problem Relation Age of Onset  . Cancer Maternal Grandmother     skin cancer on the vagina  . Cancer Paternal Grandfather     prostate    History   Social History  . Marital Status: Married    Spouse Name: N/A    Number of Children: N/A  . Years of Education: N/A   Occupational History  . Not on file.   Social History Main Topics  . Smoking status: Never Smoker   . Smokeless tobacco: Not on file  . Alcohol Use: Yes     Comment: occasional wine  . Drug Use: No  . Sexual Activity: Not on file   Other Topics Concern  . Not on file   Social History Narrative    No Known Allergies  Constitutional: Denies fever, malaise, fatigue, headache or abrupt weight changes.   GU: Pt reports urgency, frequency and pain with urination. Denies burning sensation, blood in urine, odor or discharge. Skin: Denies redness, rashes, lesions or ulcercations.   No other specific complaints in a complete review of systems (except as listed in HPI above).    Objective:   Physical Exam  BP 114/70 mmHg  Pulse 73  Temp(Src) 98 F (36.7 C) (Oral)  Wt 161 lb (73.029 kg)  SpO2 98%  LMP 02/16/2014 Wt Readings from Last 3 Encounters:  02/18/14 161 lb (73.029 kg)  08/27/13 166 lb 12 oz (75.637 kg)  01/21/13 160 lb (72.576 kg)    General: Appears her stated age, well developed, well nourished in NAD. Cardiovascular: Normal rate and rhythm. S1,S2 noted.  No murmur, rubs or gallops noted.  Pulmonary/Chest: Normal effort and positive vesicular breath sounds. No  respiratory distress. No wheezes, rales or ronchi noted.  Abdomen: Soft and nontender. Normal bowel sounds, no bruits noted. No distention or masses noted. Liver, spleen and kidneys non palpable. No CVA tenderness.      Assessment & Plan:   Urgency, Frequency, Dysuria secondary to possible UTI  Urinalysis: 1+ leuks Will send urine culture eRx sent if for Septra BID x 5 days OK to take AZO OTC Drink plenty of fluids  She request Tdap today because she is about to have a new niece. She had Td in 2008.  RTC as needed or if symptoms persist.

## 2014-02-18 NOTE — Addendum Note (Signed)
Addended by: Lurlean Nanny on: 02/18/2014 03:45 PM   Modules accepted: Orders

## 2014-02-18 NOTE — Progress Notes (Signed)
Pre visit review using our clinic review tool, if applicable. No additional management support is needed unless otherwise documented below in the visit note. 

## 2014-02-18 NOTE — Addendum Note (Signed)
Addended by: Lindalou Hose Y on: 02/18/2014 02:00 PM   Modules accepted: Orders

## 2014-02-19 LAB — URINE CULTURE: Colony Count: 60000

## 2015-04-21 DIAGNOSIS — M9901 Segmental and somatic dysfunction of cervical region: Secondary | ICD-10-CM | POA: Diagnosis not present

## 2015-04-21 DIAGNOSIS — M9903 Segmental and somatic dysfunction of lumbar region: Secondary | ICD-10-CM | POA: Diagnosis not present

## 2015-04-21 DIAGNOSIS — M5412 Radiculopathy, cervical region: Secondary | ICD-10-CM | POA: Diagnosis not present

## 2015-04-21 DIAGNOSIS — M62830 Muscle spasm of back: Secondary | ICD-10-CM | POA: Diagnosis not present

## 2015-10-12 DIAGNOSIS — J0100 Acute maxillary sinusitis, unspecified: Secondary | ICD-10-CM | POA: Diagnosis not present

## 2015-10-12 DIAGNOSIS — Z23 Encounter for immunization: Secondary | ICD-10-CM | POA: Diagnosis not present

## 2016-01-04 DIAGNOSIS — Z1231 Encounter for screening mammogram for malignant neoplasm of breast: Secondary | ICD-10-CM | POA: Diagnosis not present

## 2016-01-04 DIAGNOSIS — Z130 Encounter for screening for diseases of the blood and blood-forming organs and certain disorders involving the immune mechanism: Secondary | ICD-10-CM | POA: Diagnosis not present

## 2016-01-04 DIAGNOSIS — Z1151 Encounter for screening for human papillomavirus (HPV): Secondary | ICD-10-CM | POA: Diagnosis not present

## 2016-01-04 DIAGNOSIS — Z6828 Body mass index (BMI) 28.0-28.9, adult: Secondary | ICD-10-CM | POA: Diagnosis not present

## 2016-01-04 DIAGNOSIS — Z113 Encounter for screening for infections with a predominantly sexual mode of transmission: Secondary | ICD-10-CM | POA: Diagnosis not present

## 2016-01-04 DIAGNOSIS — Z0000 Encounter for general adult medical examination without abnormal findings: Secondary | ICD-10-CM | POA: Diagnosis not present

## 2016-01-04 DIAGNOSIS — Z1321 Encounter for screening for nutritional disorder: Secondary | ICD-10-CM | POA: Diagnosis not present

## 2016-01-04 DIAGNOSIS — Z01419 Encounter for gynecological examination (general) (routine) without abnormal findings: Secondary | ICD-10-CM | POA: Diagnosis not present

## 2016-01-04 DIAGNOSIS — Z13220 Encounter for screening for lipoid disorders: Secondary | ICD-10-CM | POA: Diagnosis not present

## 2016-01-04 DIAGNOSIS — Z1329 Encounter for screening for other suspected endocrine disorder: Secondary | ICD-10-CM | POA: Diagnosis not present

## 2016-01-05 ENCOUNTER — Other Ambulatory Visit: Payer: Self-pay | Admitting: Obstetrics and Gynecology

## 2016-01-05 DIAGNOSIS — R928 Other abnormal and inconclusive findings on diagnostic imaging of breast: Secondary | ICD-10-CM

## 2016-01-06 DIAGNOSIS — M5030 Other cervical disc degeneration, unspecified cervical region: Secondary | ICD-10-CM | POA: Diagnosis not present

## 2016-01-06 DIAGNOSIS — M542 Cervicalgia: Secondary | ICD-10-CM | POA: Diagnosis not present

## 2016-01-06 DIAGNOSIS — M545 Low back pain: Secondary | ICD-10-CM | POA: Diagnosis not present

## 2016-01-06 DIAGNOSIS — M5136 Other intervertebral disc degeneration, lumbar region: Secondary | ICD-10-CM | POA: Diagnosis not present

## 2016-01-14 ENCOUNTER — Ambulatory Visit
Admission: RE | Admit: 2016-01-14 | Discharge: 2016-01-14 | Disposition: A | Discharge status: Home / Self Care | Payer: Federal, State, Local not specified - PPO | Source: Ambulatory Visit | Attending: Obstetrics and Gynecology | Admitting: Obstetrics and Gynecology

## 2016-01-14 DIAGNOSIS — R928 Other abnormal and inconclusive findings on diagnostic imaging of breast: Secondary | ICD-10-CM

## 2016-01-14 DIAGNOSIS — R922 Inconclusive mammogram: Secondary | ICD-10-CM | POA: Diagnosis not present

## 2016-03-27 DIAGNOSIS — N3001 Acute cystitis with hematuria: Secondary | ICD-10-CM | POA: Diagnosis not present

## 2016-03-27 DIAGNOSIS — R300 Dysuria: Secondary | ICD-10-CM | POA: Diagnosis not present

## 2016-06-15 DIAGNOSIS — B029 Zoster without complications: Secondary | ICD-10-CM | POA: Diagnosis not present

## 2016-06-19 DIAGNOSIS — L239 Allergic contact dermatitis, unspecified cause: Secondary | ICD-10-CM | POA: Diagnosis not present

## 2016-06-19 DIAGNOSIS — R21 Rash and other nonspecific skin eruption: Secondary | ICD-10-CM | POA: Diagnosis not present

## 2016-09-12 ENCOUNTER — Ambulatory Visit (INDEPENDENT_AMBULATORY_CARE_PROVIDER_SITE_OTHER): Payer: Federal, State, Local not specified - PPO | Admitting: Family Medicine

## 2016-09-12 ENCOUNTER — Encounter: Payer: Self-pay | Admitting: Family Medicine

## 2016-09-12 VITALS — BP 122/78 | HR 62 | Temp 98.9°F | Wt 162.2 lb

## 2016-09-12 DIAGNOSIS — R300 Dysuria: Secondary | ICD-10-CM

## 2016-09-12 LAB — POC URINALSYSI DIPSTICK (AUTOMATED)
Glucose, UA: NEGATIVE
Ketones, UA: NEGATIVE
Nitrite, UA: POSITIVE
Protein, UA: NEGATIVE
Spec Grav, UA: >=1.03 — AB
Urobilinogen, UA: 4 U/dL — AB
pH, UA: 5

## 2016-09-12 MED ORDER — CEPHALEXIN 250 MG PO CAPS: 250.0000 mg | ORAL_CAPSULE | Freq: Two times a day (BID) | ORAL | 0 refills | Status: DC

## 2016-09-12 NOTE — Progress Notes (Signed)
Dysuria: burning yesterday, worse today, more pressure.  Started AZO today with relief.  duration of symptoms: 1-2 days abdominal pain:no fevers:no back pain:some lower back pain vomiting:no On menses, d/w pt.  Has used AZO.   U/a abnormal.   H/o similar sx with UTIs.   Meds, vitals, and allergies reviewed.   Per HPI unless specifically indicated in ROS section   GEN: nad, alert and oriented HEENT: mucous membranes moist NECK: supple CV: rrr.  PULM: ctab, no inc wob ABD: soft, +bs, suprapubic area not tender EXT: no edema SKIN: no acute rash BACK: no CVA pain

## 2016-09-12 NOTE — Assessment & Plan Note (Signed)
Likely cystitis, nontoxic, ucx, keflex, update [place] as needed.  See AVS, d/w pt, she agrees.

## 2016-09-12 NOTE — Patient Instructions (Signed)
Drink plenty of water and start the antibiotics today.  We'll contact you with your lab report.  Take care.   

## 2016-09-14 LAB — URINE CULTURE

## 2017-05-11 ENCOUNTER — Encounter: Payer: Self-pay | Admitting: Internal Medicine

## 2017-05-11 ENCOUNTER — Ambulatory Visit: Payer: Federal, State, Local not specified - PPO | Admitting: Internal Medicine

## 2017-05-11 VITALS — BP 114/74 | HR 70 | Temp 98.0°F | Ht 65.0 in | Wt 165.0 lb

## 2017-05-11 DIAGNOSIS — R21 Rash and other nonspecific skin eruption: Secondary | ICD-10-CM

## 2017-05-11 MED ORDER — TRIAMCINOLONE ACETONIDE 0.1 % EX CREA: 1.0000 "application " | TOPICAL_CREAM | Freq: Two times a day (BID) | CUTANEOUS | 1 refills | Status: DC | PRN

## 2017-05-11 MED ORDER — PREDNISONE 20 MG PO TABS: 40.0000 mg | ORAL_TABLET | Freq: Every day | ORAL | 0 refills | Status: DC

## 2017-05-11 NOTE — Progress Notes (Signed)
   Subjective:    Patient ID: Elizabeth Sheppard, female    DOB: Dec 27, 1970, 47 y.o.   MRN: [id number]  HPI Here due to rash  Noticed it on her face this morning Left side Had similar rash last year---initially thought to be shingles, but then not ?allergic reaction  Tight sensation and heat No fever No new exposures  Hasn't tried anything  Current Outpatient Medications on File Prior to Visit  Medication Sig Dispense Refill  . ALPRAZolam (XANAX) 0.5 MG tablet Take 0.5 mg by mouth daily as needed for anxiety.    Marland Kitchen zolpidem (AMBIEN CR) 12.5 MG CR tablet TAKE 1 TABLET BY MOUTH AT BEDTIME AS NEEDED FOR SLEEP 30 tablet 1   No current facility-administered medications on file prior to visit.     No Known Allergies  History reviewed. No pertinent past medical history.  Past Surgical History:  Procedure Laterality Date  . LAPAROSCOPIC ENDOMETRIOSIS FULGURATION    . TUBAL LIGATION      Family History  Problem Relation Age of Onset  . Cancer Maternal Grandmother        skin cancer on the vagina  . Cancer Paternal Grandfather        prostate    Social History   Socioeconomic History  . Marital status: Married    Spouse name: Not on file  . Number of children: Not on file  . Years of education: Not on file  . Highest education level: Not on file  Occupational History  . Not on file  Social Needs  . Financial resource strain: Not on file  . Food insecurity:    Worry: Not on file    Inability: Not on file  . Transportation needs:    Medical: Not on file    Non-medical: Not on file  Tobacco Use  . Smoking status: Never Smoker  . Smokeless tobacco: Never Used  Substance and Sexual Activity  . Alcohol use: Yes    Comment: occasional wine  . Drug use: No  . Sexual activity: Not on file  Lifestyle  . Physical activity:    Days per week: Not on file    Minutes per session: Not on file  . Stress: Not on file  Relationships  . Social connections:    Talks on  phone: Not on file    Gets together: Not on file    Attends religious service: Not on file    Active member of club or organization: Not on file    Attends meetings of clubs or organizations: Not on file    Relationship status: Not on file  . Intimate partner violence:    Fear of current or ex partner: Not on file    Emotionally abused: Not on file    Physically abused: Not on file    Forced sexual activity: Not on file  Other Topics Concern  . Not on file  Social History Narrative  . Not on file   Review of Systems Did grill last night--- but no touch No other exposures No fever No mouth or tongue swelling    Objective:   Physical Exam  Constitutional: She appears well-developed. No distress.  Skin:  Mostly papular but slightly vesicular (?) rash on left cheek and near eye Not inflamed          Assessment & Plan:

## 2017-05-11 NOTE — Assessment & Plan Note (Signed)
Looks like allergic reaction--but not clear if contact or more systemic Will try topical steroid Rx for prednisone if worsens

## 2017-05-11 NOTE — Patient Instructions (Signed)
Start the cream now and use the prednisone pills if the rash is still worsening.

## 2017-05-30 DIAGNOSIS — Z1231 Encounter for screening mammogram for malignant neoplasm of breast: Secondary | ICD-10-CM | POA: Diagnosis not present

## 2017-05-30 DIAGNOSIS — Z01419 Encounter for gynecological examination (general) (routine) without abnormal findings: Secondary | ICD-10-CM | POA: Diagnosis not present

## 2017-05-30 DIAGNOSIS — Z6826 Body mass index (BMI) 26.0-26.9, adult: Secondary | ICD-10-CM | POA: Diagnosis not present

## 2017-06-01 DIAGNOSIS — L821 Other seborrheic keratosis: Secondary | ICD-10-CM | POA: Diagnosis not present

## 2017-06-01 DIAGNOSIS — B078 Other viral warts: Secondary | ICD-10-CM | POA: Diagnosis not present

## 2017-06-01 DIAGNOSIS — D485 Neoplasm of uncertain behavior of skin: Secondary | ICD-10-CM | POA: Diagnosis not present

## 2017-07-09 DIAGNOSIS — D2260 Melanocytic nevi of unspecified upper limb, including shoulder: Secondary | ICD-10-CM | POA: Diagnosis not present

## 2017-07-09 DIAGNOSIS — Z1283 Encounter for screening for malignant neoplasm of skin: Secondary | ICD-10-CM | POA: Diagnosis not present

## 2017-07-09 DIAGNOSIS — D2270 Melanocytic nevi of unspecified lower limb, including hip: Secondary | ICD-10-CM | POA: Diagnosis not present

## 2017-07-09 DIAGNOSIS — D239 Other benign neoplasm of skin, unspecified: Secondary | ICD-10-CM

## 2017-07-09 DIAGNOSIS — D225 Melanocytic nevi of trunk: Secondary | ICD-10-CM | POA: Diagnosis not present

## 2017-07-09 DIAGNOSIS — D485 Neoplasm of uncertain behavior of skin: Secondary | ICD-10-CM | POA: Diagnosis not present

## 2017-07-09 HISTORY — DX: Other benign neoplasm of skin, unspecified: D23.9

## 2017-09-26 ENCOUNTER — Ambulatory Visit: Payer: Federal, State, Local not specified - PPO | Admitting: Family Medicine

## 2017-09-26 ENCOUNTER — Encounter: Payer: Self-pay | Admitting: Family Medicine

## 2017-09-26 VITALS — BP 134/68 | HR 92 | Temp 98.9°F | Ht 65.0 in | Wt 167.0 lb

## 2017-09-26 DIAGNOSIS — Z23 Encounter for immunization: Secondary | ICD-10-CM | POA: Diagnosis not present

## 2017-09-26 DIAGNOSIS — N3000 Acute cystitis without hematuria: Secondary | ICD-10-CM | POA: Diagnosis not present

## 2017-09-26 DIAGNOSIS — R300 Dysuria: Secondary | ICD-10-CM

## 2017-09-26 DIAGNOSIS — N390 Urinary tract infection, site not specified: Secondary | ICD-10-CM | POA: Insufficient documentation

## 2017-09-26 DIAGNOSIS — B373 Candidiasis of vulva and vagina: Secondary | ICD-10-CM

## 2017-09-26 DIAGNOSIS — B3731 Acute candidiasis of vulva and vagina: Secondary | ICD-10-CM | POA: Insufficient documentation

## 2017-09-26 LAB — POCT WET PREP (WET MOUNT): TRICHOMONAS WET PREP HPF POC: ABSENT

## 2017-09-26 LAB — POC URINALSYSI DIPSTICK (AUTOMATED)
Bilirubin, UA: NEGATIVE
GLUCOSE UA: NEGATIVE
NITRITE UA: NEGATIVE
Protein, UA: NEGATIVE
RBC UA: NEGATIVE
Spec Grav, UA: 1.02 (ref 1.010–1.025)
UROBILINOGEN UA: 0.2 U/dL
pH, UA: 6 (ref 5.0–8.0)

## 2017-09-26 MED ORDER — FLUCONAZOLE 150 MG PO TABS: ORAL_TABLET | ORAL | 0 refills | Status: DC

## 2017-09-26 MED ORDER — CIPROFLOXACIN HCL 250 MG PO TABS: 250.0000 mg | ORAL_TABLET | Freq: Two times a day (BID) | ORAL | 0 refills | Status: DC

## 2017-09-26 NOTE — Progress Notes (Signed)
Subjective:    Patient ID: Elizabeth Sheppard, female    DOB: May 04, 1970, 47 y.o.   MRN: [id number]  HPI  Here for low back pain, dysuria and vaginal d/c  Had a uti over the holiday weekend - took 3 d of cipro   (Dr Prescott Parma)- did not get a urine culture  Got better  Just a lingering dysuria - feels improved but not gone  Low back aches a bit also  No blood in urine   Some cottage cheese d/c  Little burning /not itching  Gets yeast often  No odor  No pelvic pain   No exp to STDs    Getting ready to leave for work 3 weeks    Results for orders placed or performed in visit on 09/26/17  POCT Urinalysis Dipstick (Automated)  Result Value Ref Range   Color, UA Yellow    Clarity, UA Hazy    Glucose, UA Negative Negative   Bilirubin, UA Negative    Ketones, UA Trace    Spec Grav, UA 1.020 1.010 - 1.025   Blood, UA Negative    pH, UA 6.0 5.0 - 8.0   Protein, UA Negative Negative   Urobilinogen, UA 0.2 0.2 or 1.0 E.U./dL   Nitrite, UA Negative    Leukocytes, UA Moderate (2+) (A) Negative    Patient Active Problem List   Diagnosis Date Noted  . UTI (urinary tract infection) 09/26/2017  . Yeast vaginitis 09/26/2017  . Rash 05/11/2017  . Dysuria 09/12/2016  . Anxiety 01/21/2013  . Chronic insomnia 12/19/2011  . LEG PAIN 09/13/2009  . INSOMNIA, CHRONIC 01/21/2009  . EXTERNAL HEMORRHOIDS 05/11/2008  . NEOPLASM OF UNCERTAIN BEHAVIOR OF SKIN 01/22/2008   History reviewed. No pertinent past medical history. Past Surgical History:  Procedure Laterality Date  . LAPAROSCOPIC ENDOMETRIOSIS FULGURATION    . TUBAL LIGATION     Social History   Tobacco Use  . Smoking status: Never Smoker  . Smokeless tobacco: Never Used  Substance Use Topics  . Alcohol use: Yes    Comment: occasional wine  . Drug use: No   Family History  Problem Relation Age of Onset  . Cancer Maternal Grandmother        skin cancer on the vagina  . Cancer Paternal Grandfather        prostate    No Known Allergies Current Outpatient Medications on File Prior to Visit  Medication Sig Dispense Refill  . ALPRAZolam (XANAX) 0.5 MG tablet Take 0.5 mg by mouth daily as needed for anxiety.    Marland Kitchen zolpidem (AMBIEN CR) 12.5 MG CR tablet TAKE 1 TABLET BY MOUTH AT BEDTIME AS NEEDED FOR SLEEP (Patient not taking: Reported on 09/26/2017) 30 tablet 1   No current facility-administered medications on file prior to visit.      Review of Systems  Constitutional: Negative for activity change, appetite change, fatigue, fever and unexpected weight change.  HENT: Negative for congestion, ear pain, rhinorrhea, sinus pressure and sore throat.   Eyes: Negative for pain, redness and visual disturbance.  Respiratory: Negative for cough, shortness of breath and wheezing.   Cardiovascular: Negative for chest pain and palpitations.  Gastrointestinal: Negative for abdominal pain, blood in stool, constipation and diarrhea.  Endocrine: Negative for polydipsia and polyuria.  Genitourinary: Positive for dysuria, frequency and vaginal discharge. Negative for decreased urine volume, pelvic pain, urgency and vaginal pain.  Musculoskeletal: Negative for arthralgias, back pain and myalgias.       Mild low  back pain No flank pain   Skin: Negative for pallor and rash.  Allergic/Immunologic: Negative for environmental allergies.  Neurological: Negative for dizziness, syncope and headaches.  Hematological: Negative for adenopathy. Does not bruise/bleed easily.  Psychiatric/Behavioral: Negative for decreased concentration and dysphoric mood. The patient is not nervous/anxious.        Objective:   Physical Exam  Constitutional: She appears well-developed and well-nourished. No distress.  Well appearing   HENT:  Head: Normocephalic and atraumatic.  Eyes: Pupils are equal, round, and reactive to light. Conjunctivae and EOM are normal.  Neck: Normal range of motion. Neck supple.  Cardiovascular: Normal rate, regular  rhythm and normal heart sounds.  Pulmonary/Chest: Effort normal and breath sounds normal.  Abdominal: Soft. Bowel sounds are normal. She exhibits no distension. There is tenderness. There is no rebound.  No cva tenderness  Mild suprapubic tenderness  Genitourinary: Vaginal discharge found.  Genitourinary Comments: Pale vaginal d/c w/o odor  Nl appearing vaginal mucosa No excoriations or lesions  Swab obt for wet prep   Wet prep pos for hyphae   Musculoskeletal: She exhibits no edema.  Lymphadenopathy:    She has no cervical adenopathy.  Neurological: She is alert. She displays normal reflexes. Coordination normal.  Skin: No rash noted. No erythema. No pallor.  Psychiatric: She has a normal mood and affect.  Pleasant           Assessment & Plan:   Problem List Items Addressed This Visit      Genitourinary   UTI (urinary tract infection) - Primary    Recurrent uti  Recent tx with 3 d of cipro-not clinically totally resolved Leukocytes on UA Pending cx tx with 5 more days of cipro /or until cx returns  Disc ways to prevent uti  Inc water intake       Relevant Medications   fluconazole (DIFLUCAN) 150 MG tablet   Other Relevant Orders   Urine Culture   Yeast vaginitis    With hyphae on wet prep  S/p 3 d of cipro for uti tx with diflucan 150 now  Repeat after finishing abx for uti  Update if not starting to improve in a week or if worsening        Relevant Medications   fluconazole (DIFLUCAN) 150 MG tablet   Other Relevant Orders   POCT Wet Prep (Wet [place]) (Completed)     Other   Dysuria    Pos ua  Symptoms not entirely gone from uti  Will tx and update       Relevant Orders   POCT Urinalysis Dipstick (Automated) (Completed)    Other Visit Diagnoses    Need for influenza vaccination       Relevant Orders   Flu Vaccine QUAD 6+ mos PF IM (Fluarix Quad PF) (Completed)

## 2017-09-26 NOTE — Assessment & Plan Note (Signed)
Recurrent uti  Recent tx with 3 d of cipro-not clinically totally resolved Leukocytes on UA Pending cx tx with 5 more days of cipro /or until cx returns  Disc ways to prevent uti  Inc water intake

## 2017-09-26 NOTE — Assessment & Plan Note (Signed)
With hyphae on wet prep  S/p 3 d of cipro for uti tx with diflucan 150 now  Repeat after finishing abx for uti  Update if not starting to improve in a week or if worsening

## 2017-09-26 NOTE — Assessment & Plan Note (Signed)
Pos ua  Symptoms not entirely gone from uti  Will tx and update

## 2017-09-26 NOTE — Patient Instructions (Addendum)
Drink more water to prevent utis Aim for 64 oz of fluid per day- mostly water   Take the cipro as directed  We will call you with a culture result   Do not hold urine overly long  Always urinate before and after intercourse  Wipe front to back   Take the diflucan for yeast as directed    Urinary Tract Infection, Adult A urinary tract infection (UTI) is an infection of any part of the urinary tract, which includes the kidneys, ureters, bladder, and urethra. These organs make, store, and get rid of urine in the body. UTI can be a bladder infection (cystitis) or kidney infection (pyelonephritis). What are the causes? This infection may be caused by fungi, viruses, or bacteria. Bacteria are the most common cause of UTIs. This condition can also be caused by repeated incomplete emptying of the bladder during urination. What increases the risk? This condition is more likely to develop if:  You ignore your need to urinate or hold urine for long periods of time.  You do not empty your bladder completely during urination.  You wipe back to front after urinating or having a bowel movement, if you are female.  You are uncircumcised, if you are female.  You are constipated.  You have a urinary catheter that stays in place (indwelling).  You have a weak defense (immune) system.  You have a medical condition that affects your bowels, kidneys, or bladder.  You have diabetes.  You take antibiotic medicines frequently or for long periods of time, and the antibiotics no longer work well against certain types of infections (antibiotic resistance).  You take medicines that irritate your urinary tract.  You are exposed to chemicals that irritate your urinary tract.  You are female.  What are the signs or symptoms? Symptoms of this condition include:  Fever.  Frequent urination or passing small amounts of urine frequently.  Needing to urinate urgently.  Pain or burning with  urination.  Urine that smells bad or unusual.  Cloudy urine.  Pain in the lower abdomen or back.  Trouble urinating.  Blood in the urine.  Vomiting or being less hungry than normal.  Diarrhea or abdominal pain.  Vaginal discharge, if you are female.  How is this diagnosed? This condition is diagnosed with a medical history and physical exam. You will also need to provide a urine sample to test your urine. Other tests may be done, including:  Blood tests.  Sexually transmitted disease (STD) testing.  If you have had more than one UTI, a cystoscopy or imaging studies may be done to determine the cause of the infections. How is this treated? Treatment for this condition often includes a combination of two or more of the following:  Antibiotic medicine.  Other medicines to treat less common causes of UTI.  Over-the-counter medicines to treat pain.  Drinking enough water to stay hydrated.  Follow these instructions at home:  Take over-the-counter and prescription medicines only as told by your health care provider.  If you were prescribed an antibiotic, take it as told by your health care provider. Do not stop taking the antibiotic even if you start to feel better.  Avoid alcohol, caffeine, tea, and carbonated beverages. They can irritate your bladder.  Drink enough fluid to keep your urine clear or pale yellow.  Keep all follow-up visits as told by your health care provider. This is important.  Make sure to: ? Empty your bladder often and completely. Do not  hold urine for long periods of time. ? Empty your bladder before and after sex. ? Wipe from front to back after a bowel movement if you are female. Use each tissue one time when you wipe. Contact a health care provider if:  You have back pain.  You have a fever.  You feel nauseous or vomit.  Your symptoms do not get better after 3 days.  Your symptoms go away and then return. Get help right away  if:  You have severe back pain or lower abdominal pain.  You are vomiting and cannot keep down any medicines or water. This information is not intended to replace advice given to you by your health care provider. Make sure you discuss any questions you have with your health care provider. Document Released: 10/12/2004 Document Revised: 06/16/2015 Document Reviewed: 11/23/2014 Elsevier Interactive Patient Education  [employer].

## 2017-09-27 LAB — URINE CULTURE
MICRO NUMBER: 91088173
RESULT: NO GROWTH
SPECIMEN QUALITY: ADEQUATE

## 2017-10-30 IMAGING — MG 2D DIGITAL DIAGNOSTIC UNILATERAL LEFT MAMMOGRAM WITH CAD AND ADJ
6 series · 6 of 14 positions shown · non-contrast
Comparison: 01/04/2016, 07/07/2013 and earlier.

CLINICAL DATA: Recall from screening mammography, possible focal
asymmetry in the lower outer left breast.

EXAM:
2D DIGITAL DIAGNOSTIC UNILATERAL LEFT MAMMOGRAM AND ADJUNCT TOMO

[L CC]
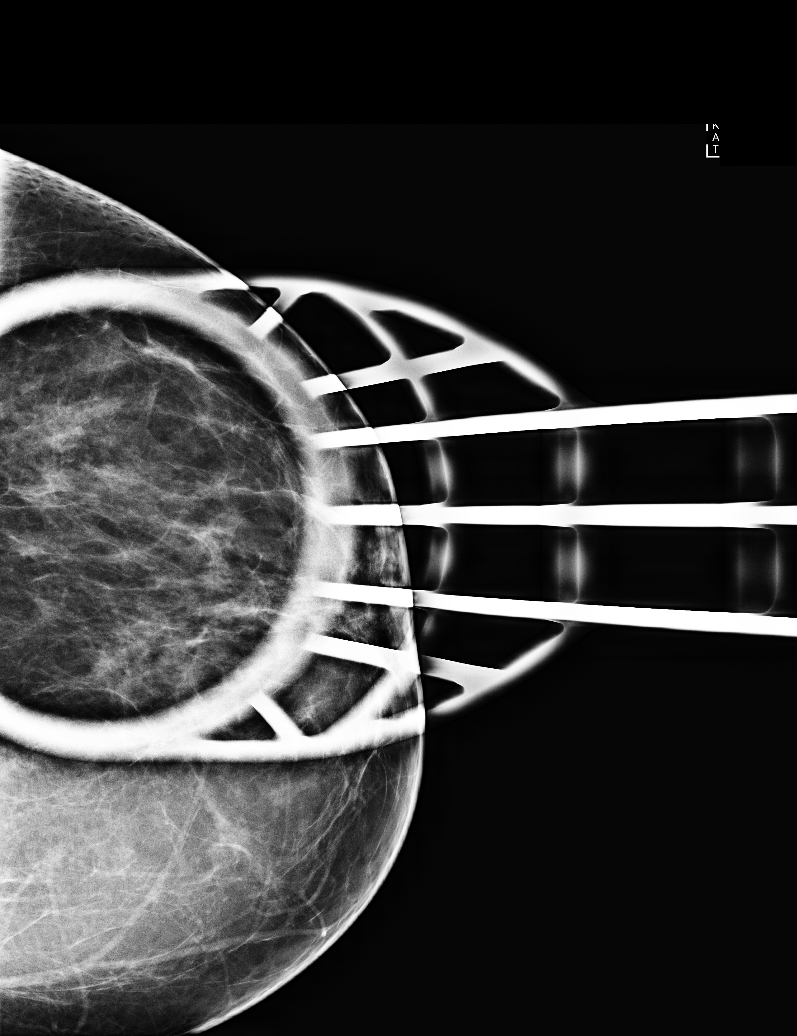

[L MLO]
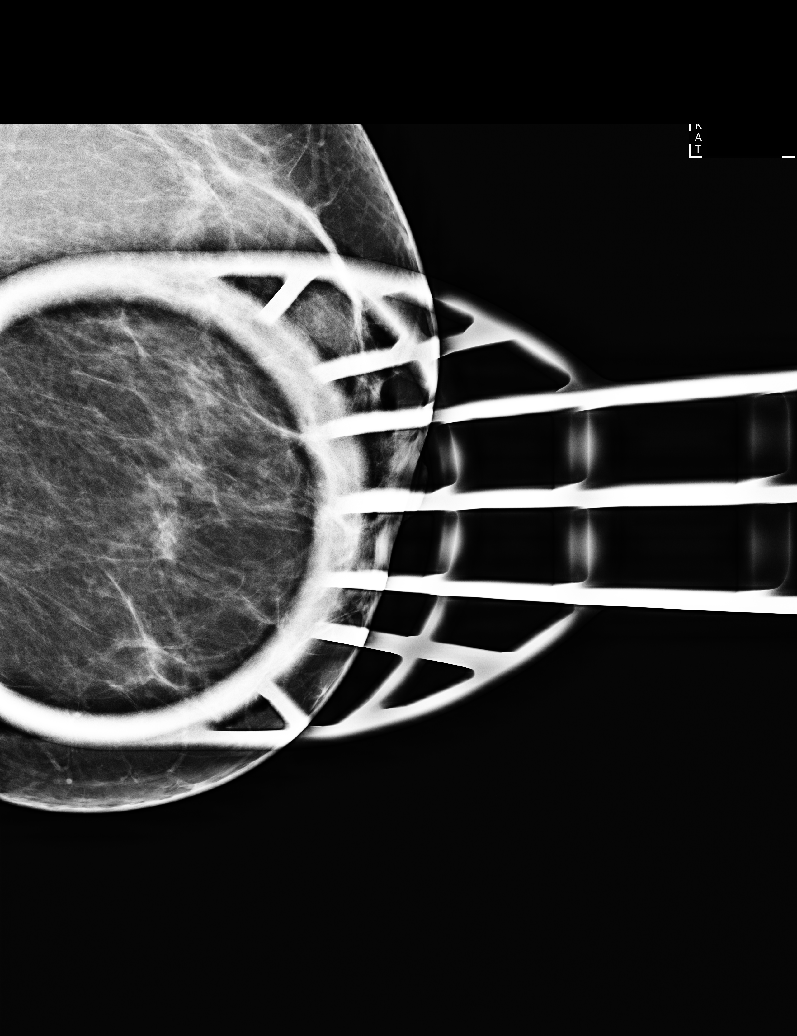

[L CC synth-2D]
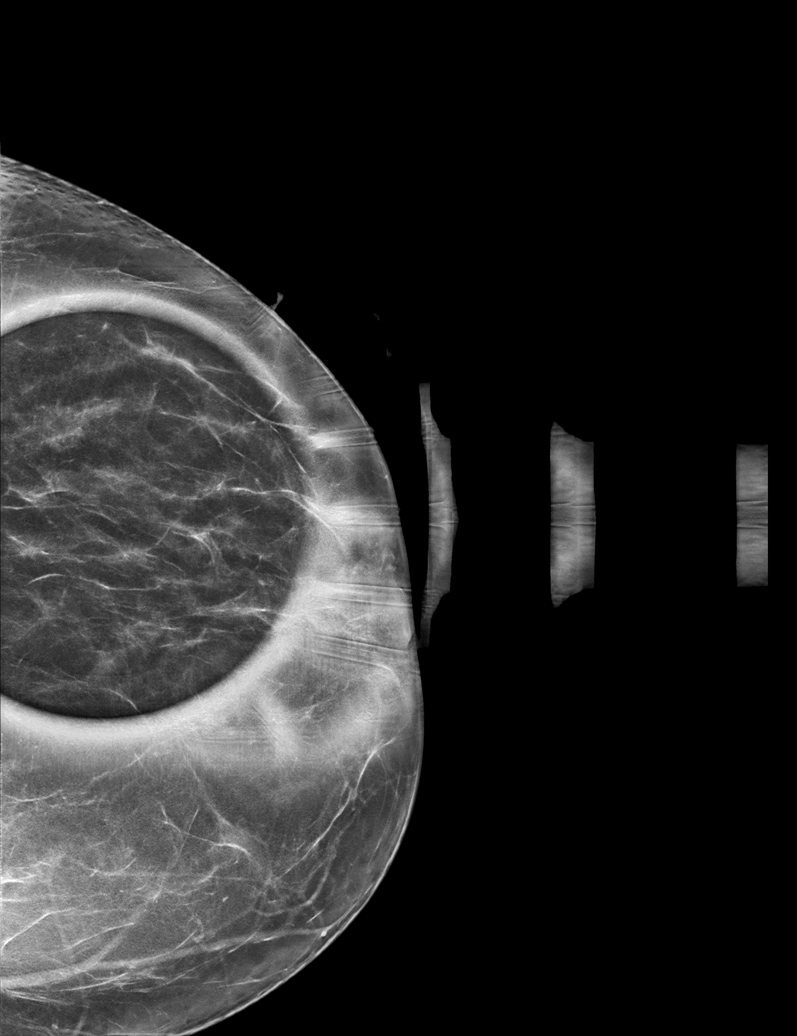

[L MLO synth-2D]
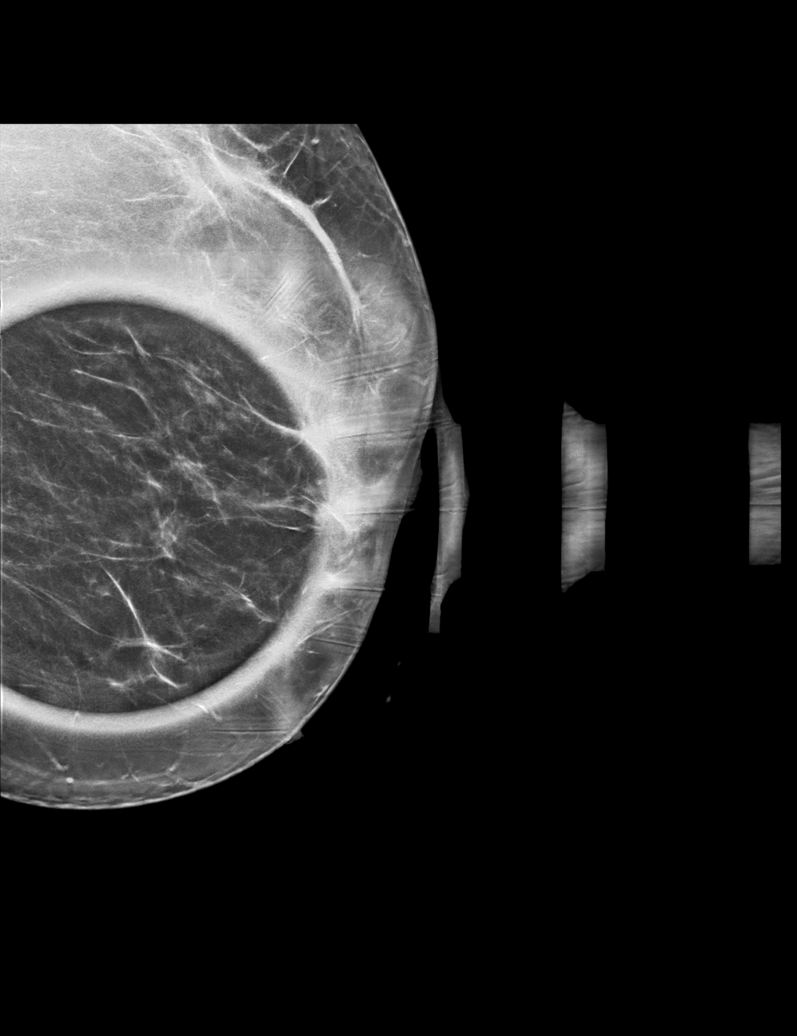

[L MLO tomo · tomo slice 34/67.0]
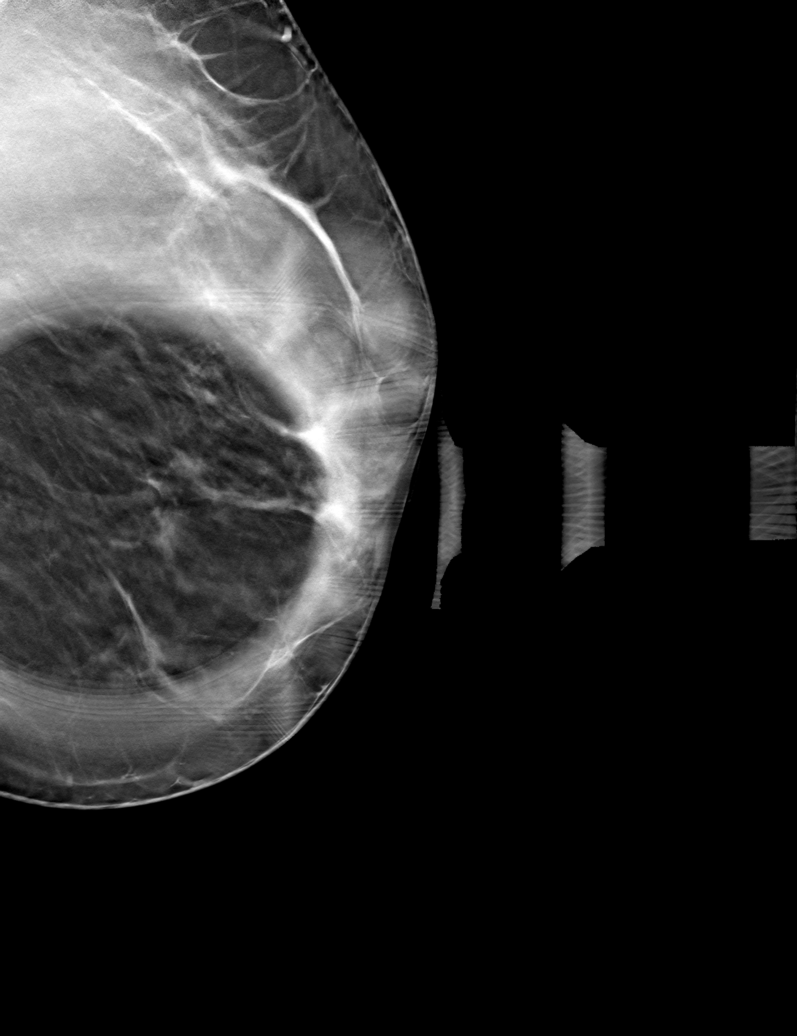

[L CC tomo · tomo slice 31/62.0]
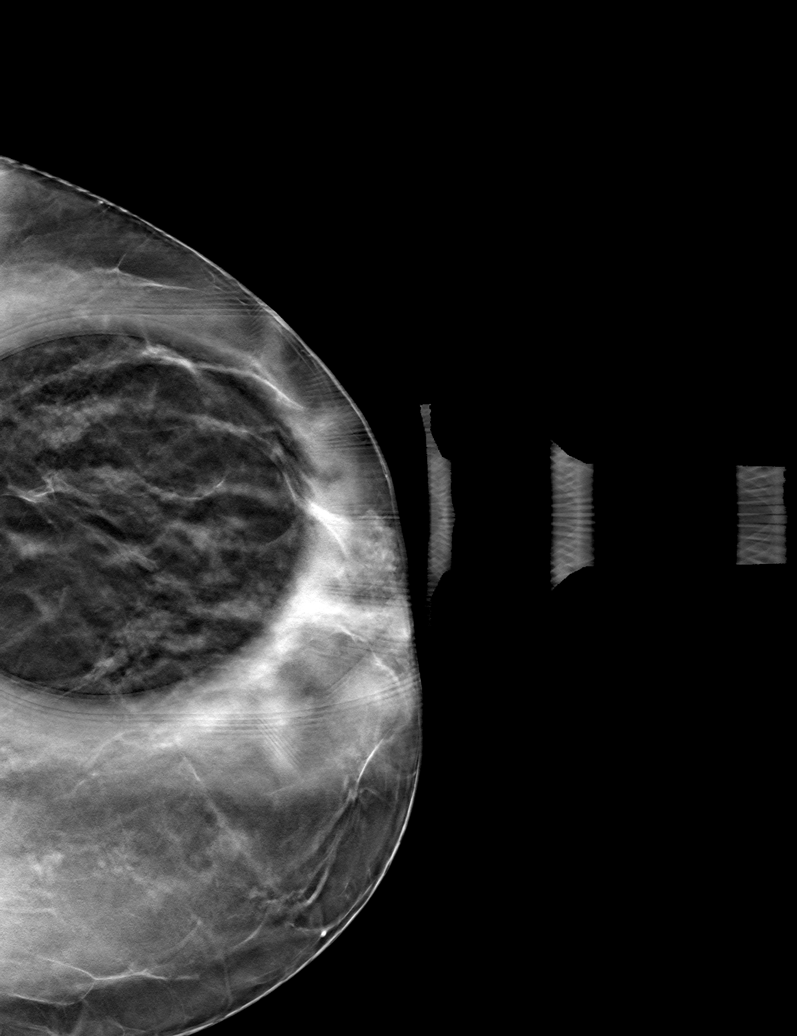

[6 of 14 positions shown; findings below may reference images not displayed]

ACR Breast Density Category c: The breast tissue is heterogeneously
dense, which may obscure small masses.
FINDINGS: Standard and tomosynthesis spot-compression CC and MLO views of the
area of concern in the left breast were obtained. No persistent
mass, architectural distortion or suspicious calcification in the
area of concern on the screening mammogram.
IMPRESSION: No mammographic evidence of malignancy, left breast. The area of
concern on the screening mammogram is consistent with a summation of
overlapping fibroglandular tissue.

RECOMMENDATION:
Screening mammogram in one year.(Code:KT-[id number])

I have discussed the findings and recommendations with the patient.
Results were also provided in writing at the conclusion of the
visit. If applicable, a reminder letter will be sent to the patient
regarding the next appointment.

BI-RADS CATEGORY  1: Negative.

## 2018-02-04 DIAGNOSIS — M62830 Muscle spasm of back: Secondary | ICD-10-CM | POA: Diagnosis not present

## 2018-02-04 DIAGNOSIS — M5412 Radiculopathy, cervical region: Secondary | ICD-10-CM | POA: Diagnosis not present

## 2018-02-04 DIAGNOSIS — M9901 Segmental and somatic dysfunction of cervical region: Secondary | ICD-10-CM | POA: Diagnosis not present

## 2018-02-04 DIAGNOSIS — M9903 Segmental and somatic dysfunction of lumbar region: Secondary | ICD-10-CM | POA: Diagnosis not present

## 2018-02-05 DIAGNOSIS — M62830 Muscle spasm of back: Secondary | ICD-10-CM | POA: Diagnosis not present

## 2018-02-05 DIAGNOSIS — M9901 Segmental and somatic dysfunction of cervical region: Secondary | ICD-10-CM | POA: Diagnosis not present

## 2018-02-05 DIAGNOSIS — M9903 Segmental and somatic dysfunction of lumbar region: Secondary | ICD-10-CM | POA: Diagnosis not present

## 2018-02-05 DIAGNOSIS — M5412 Radiculopathy, cervical region: Secondary | ICD-10-CM | POA: Diagnosis not present

## 2018-02-07 DIAGNOSIS — M62830 Muscle spasm of back: Secondary | ICD-10-CM | POA: Diagnosis not present

## 2018-02-07 DIAGNOSIS — M9903 Segmental and somatic dysfunction of lumbar region: Secondary | ICD-10-CM | POA: Diagnosis not present

## 2018-02-07 DIAGNOSIS — M9901 Segmental and somatic dysfunction of cervical region: Secondary | ICD-10-CM | POA: Diagnosis not present

## 2018-02-07 DIAGNOSIS — M5412 Radiculopathy, cervical region: Secondary | ICD-10-CM | POA: Diagnosis not present

## 2018-02-20 DIAGNOSIS — R509 Fever, unspecified: Secondary | ICD-10-CM | POA: Diagnosis not present

## 2018-02-20 DIAGNOSIS — J069 Acute upper respiratory infection, unspecified: Secondary | ICD-10-CM | POA: Diagnosis not present

## 2018-07-09 DIAGNOSIS — D2270 Melanocytic nevi of unspecified lower limb, including hip: Secondary | ICD-10-CM | POA: Diagnosis not present

## 2018-07-09 DIAGNOSIS — D2260 Melanocytic nevi of unspecified upper limb, including shoulder: Secondary | ICD-10-CM | POA: Diagnosis not present

## 2018-07-09 DIAGNOSIS — D485 Neoplasm of uncertain behavior of skin: Secondary | ICD-10-CM | POA: Diagnosis not present

## 2018-07-09 DIAGNOSIS — Z1283 Encounter for screening for malignant neoplasm of skin: Secondary | ICD-10-CM | POA: Diagnosis not present

## 2018-07-09 DIAGNOSIS — D224 Melanocytic nevi of scalp and neck: Secondary | ICD-10-CM | POA: Diagnosis not present

## 2018-07-09 DIAGNOSIS — L570 Actinic keratosis: Secondary | ICD-10-CM | POA: Diagnosis not present

## 2018-07-09 DIAGNOSIS — L578 Other skin changes due to chronic exposure to nonionizing radiation: Secondary | ICD-10-CM | POA: Diagnosis not present

## 2018-08-30 ENCOUNTER — Encounter: Payer: Self-pay | Admitting: Family Medicine

## 2018-08-30 DIAGNOSIS — Z130 Encounter for screening for diseases of the blood and blood-forming organs and certain disorders involving the immune mechanism: Secondary | ICD-10-CM | POA: Diagnosis not present

## 2018-08-30 DIAGNOSIS — Z1329 Encounter for screening for other suspected endocrine disorder: Secondary | ICD-10-CM | POA: Diagnosis not present

## 2018-08-30 DIAGNOSIS — Z6830 Body mass index (BMI) 30.0-30.9, adult: Secondary | ICD-10-CM | POA: Diagnosis not present

## 2018-08-30 DIAGNOSIS — Z1231 Encounter for screening mammogram for malignant neoplasm of breast: Secondary | ICD-10-CM | POA: Diagnosis not present

## 2018-08-30 DIAGNOSIS — Z0000 Encounter for general adult medical examination without abnormal findings: Secondary | ICD-10-CM | POA: Diagnosis not present

## 2018-08-30 DIAGNOSIS — Z01419 Encounter for gynecological examination (general) (routine) without abnormal findings: Secondary | ICD-10-CM | POA: Diagnosis not present

## 2018-08-30 DIAGNOSIS — Z13220 Encounter for screening for lipoid disorders: Secondary | ICD-10-CM | POA: Diagnosis not present

## 2018-09-13 DIAGNOSIS — R1031 Right lower quadrant pain: Secondary | ICD-10-CM | POA: Diagnosis not present

## 2018-10-22 ENCOUNTER — Other Ambulatory Visit: Payer: Self-pay

## 2018-10-22 ENCOUNTER — Ambulatory Visit (INDEPENDENT_AMBULATORY_CARE_PROVIDER_SITE_OTHER): Payer: Federal, State, Local not specified - PPO | Admitting: Family Medicine

## 2018-10-22 ENCOUNTER — Encounter: Payer: Self-pay | Admitting: Family Medicine

## 2018-10-22 DIAGNOSIS — J029 Acute pharyngitis, unspecified: Secondary | ICD-10-CM

## 2018-10-22 MED ORDER — FAMOTIDINE 40 MG PO TABS: 40.0000 mg | ORAL_TABLET | Freq: Every day | ORAL | 5 refills | Status: DC

## 2018-10-22 NOTE — Progress Notes (Signed)
Virtual Visit via Video Note  I connected with Elizabeth Sheppard on 10/22/18 at  8:00 AM EDT by a video enabled telemedicine application and verified that I am speaking with the correct person using two identifiers.  Location: Patient: home Provider: office    I discussed the limitations of evaluation and management by telemedicine and the availability of in person appointments. The patient expressed understanding and agreed to proceed.  History of Present Illness: Pt presents with an itchy sore throat (mild)   She had ST starting in march  On and off ever since  Thinks it may be due to allergies   No other symptoms  No nasal issues  Eyes do not itch or swallow Ears feel fine  Neck does not feel swollen -no lumps/lymph nodes noted   No trouble swallowing  No dry mouth   Some hoarseness on and off -first thing  In the am   No cough or wheeze  No itching in chest  No fever or chills or aches   In the mirror-no throat redness   occ feels like she has heartburn /indigestion  No n/v/d  Has gained weight    No rashes at all   No sick exposures Works mostly at home  [employer]   No otc medicines  She has not tried any allergy medicines at all -would consider it     Patient Active Problem List   Diagnosis Date Noted  . Sore throat 10/22/2018  . Dysuria 09/12/2016  . Anxiety 01/21/2013  . Chronic insomnia 12/19/2011  . LEG PAIN 09/13/2009  . INSOMNIA, CHRONIC 01/21/2009  . EXTERNAL HEMORRHOIDS 05/11/2008  . NEOPLASM OF UNCERTAIN BEHAVIOR OF SKIN 01/22/2008   History reviewed. No pertinent past medical history. Past Surgical History:  Procedure Laterality Date  . LAPAROSCOPIC ENDOMETRIOSIS FULGURATION    . TUBAL LIGATION     Social History   Tobacco Use  . Smoking status: Never Smoker  . Smokeless tobacco: Never Used  Substance Use Topics  . Alcohol use: Yes    Comment: occasional wine  . Drug use: No   Family History  Problem Relation Age  of Onset  . Cancer Maternal Grandmother        skin cancer on the vagina  . Cancer Paternal Grandfather        prostate   No Known Allergies Current Outpatient Medications on File Prior to Visit  Medication Sig Dispense Refill  . ALPRAZolam (XANAX) 0.5 MG tablet Take 0.5 mg by mouth daily as needed for anxiety.    Marland Kitchen zolpidem (AMBIEN CR) 12.5 MG CR tablet TAKE 1 TABLET BY MOUTH AT BEDTIME AS NEEDED FOR SLEEP 30 tablet 1   No current facility-administered medications on file prior to visit.    Review of Systems  Constitutional: Negative for chills, diaphoresis, fever, malaise/fatigue and weight loss.  HENT: Positive for sore throat. Negative for congestion, ear discharge, ear pain, sinus pain and tinnitus.   Eyes: Negative for blurred vision, discharge and redness.  Respiratory: Negative for cough, shortness of breath, wheezing and stridor.   Cardiovascular: Negative for chest pain and palpitations.  Gastrointestinal: Positive for heartburn. Negative for abdominal pain, diarrhea, nausea and vomiting.  Musculoskeletal: Negative for myalgias.  Skin: Negative for rash.  Neurological: Negative for dizziness and headaches.    Observations/Objective: Patient appears well, in no distress Weight is baseline  No facial swelling or asymmetry Normal voice-not hoarse and no slurred speech Tongue appears normal  No obvious tremor  or mobility impairment Moving neck and UEs normally Able to hear the call well  No cough or shortness of breath during interview  Talkative and mentally sharp with no cognitive changes No skin changes on face or neck , no rash or pallor Affect is normal    Assessment and Plan: Problem List Items Addressed This Visit      Other   Sore throat    Since march w/o other symptoms (besides occ indigestion with wt gain)  May be a bit hoarse just upon awakening  Does not think she snores loudly No tobacco use  No h/o thrush No sick exposures at all  Disc poss incl  allergies/ GERD/ less likely infectious  inst to try otc antihistamine like claritin  Sent px for low dose pepcid to her pharmacy  inst to update in 2 weeks if no imp (consider ENT ref) , or earlier if symptoms worsen           Follow Up Instructions: Drink fluids Try pepcid 20 mg daily for acid reflux (helps allergies also)  claritin or other antihistamine otc  Be mindful of worse or new symptoms and let us know  Call in 2 wk if not improved-we will refer you to an ENT provider    I discussed the assessment and treatment plan with the patient. The patient was provided an opportunity to ask questions and all were answered. The patient agreed with the plan and demonstrated an understanding of the instructions.   The patient was advised to call back or seek an in-person evaluation if the symptoms worsen or if the condition fails to improve as anticipated.     Loura Pardon, MD

## 2018-10-22 NOTE — Patient Instructions (Signed)
Drink fluids Try pepcid 20 mg daily for acid reflux (helps allergies also)  claritin or other antihistamine otc  Be mindful of worse or new symptoms and let us know  Call in 2 wk if not improved-we will refer you to an ENT provider

## 2018-10-22 NOTE — Assessment & Plan Note (Addendum)
Since march w/o other symptoms (besides occ indigestion with wt gain)  May be a bit hoarse just upon awakening  Does not think she snores loudly No tobacco use  No h/o thrush No sick exposures at all  Disc poss incl allergies/ GERD/ less likely infectious  inst to try otc antihistamine like claritin  Sent px for low dose pepcid to her pharmacy  inst to update in 2 weeks if no imp (consider ENT ref) , or earlier if symptoms worsen

## 2019-02-27 DIAGNOSIS — Z20822 Contact with and (suspected) exposure to covid-19: Secondary | ICD-10-CM | POA: Diagnosis not present

## 2019-02-27 DIAGNOSIS — Z20828 Contact with and (suspected) exposure to other viral communicable diseases: Secondary | ICD-10-CM | POA: Diagnosis not present

## 2019-06-30 ENCOUNTER — Ambulatory Visit: Payer: Federal, State, Local not specified - PPO | Admitting: Dermatology

## 2019-06-30 ENCOUNTER — Other Ambulatory Visit: Payer: Self-pay

## 2019-06-30 DIAGNOSIS — D225 Melanocytic nevi of trunk: Secondary | ICD-10-CM

## 2019-06-30 DIAGNOSIS — Z1283 Encounter for screening for malignant neoplasm of skin: Secondary | ICD-10-CM

## 2019-06-30 DIAGNOSIS — L738 Other specified follicular disorders: Secondary | ICD-10-CM

## 2019-06-30 DIAGNOSIS — L239 Allergic contact dermatitis, unspecified cause: Secondary | ICD-10-CM

## 2019-06-30 DIAGNOSIS — L578 Other skin changes due to chronic exposure to nonionizing radiation: Secondary | ICD-10-CM

## 2019-06-30 DIAGNOSIS — L814 Other melanin hyperpigmentation: Secondary | ICD-10-CM

## 2019-06-30 DIAGNOSIS — Z86018 Personal history of other benign neoplasm: Secondary | ICD-10-CM | POA: Diagnosis not present

## 2019-06-30 DIAGNOSIS — D229 Melanocytic nevi, unspecified: Secondary | ICD-10-CM

## 2019-06-30 DIAGNOSIS — L720 Epidermal cyst: Secondary | ICD-10-CM | POA: Diagnosis not present

## 2019-06-30 DIAGNOSIS — D1800 Hemangioma unspecified site: Secondary | ICD-10-CM

## 2019-06-30 DIAGNOSIS — L821 Other seborrheic keratosis: Secondary | ICD-10-CM

## 2019-06-30 MED ORDER — ALCLOMETASONE DIPROPIONATE 0.05 % EX OINT: TOPICAL_OINTMENT | CUTANEOUS | 1 refills | Status: DC

## 2019-06-30 NOTE — Progress Notes (Signed)
   Follow-Up Visit   Subjective  Elizabeth Sheppard is a 49 y.o. female who presents for the following: Annual Exam (Total body skin exam, hx of Dysplastic nevus L upper flank); bump (forehead ~58m); and lips with hx of swelling, peeling, dry (comes and goes ~35yr, happened after LN2, no new lip products, doesnt notice any relation to foods, no hx of allergies, no hx of HSV). No new toothpaste.   The following portions of the chart were reviewed this encounter and updated as appropriate:      Review of Systems:  No other skin or systemic complaints except as noted in HPI or Assessment and Plan.  Objective  Well appearing patient in no apparent distress; mood and affect are within normal limits.  A full examination was performed including scalp, head, eyes, ears, nose, lips, neck, chest, axillae, abdomen, back, buttocks, bilateral upper extremities, bilateral lower extremities, hands, feet, fingers, toes, fingernails, and toenails. All findings within normal limits unless otherwise noted below.  Objective  Left upper flank: Scar with no evidence of recurrence.   Objective  L upper glabella: 2.40mm firm flesh pap  Objective  Left anterior thigh: 9.35mm waxy tan macule  Objective  R super pubic area: 8.41mm pink brown pap  Objective  face: Small yellow papules with a central dell.   Objective  Lips: Lips clear today   Assessment & Plan    Lentigines - Scattered tan macules - Discussed due to sun exposure - Benign, observe - Call for any changes  Seborrheic Keratoses - Stuck-on, waxy, tan-brown papules and plaques  - Discussed benign etiology and prognosis. - Observe - Call for any changes  Melanocytic Nevi - Tan-brown and/or pink-flesh-colored symmetric macules and papules - Benign appearing on exam today - Observation - Call clinic for new or changing moles - Recommend daily use of broad spectrum spf 30+ sunscreen to sun-exposed areas.   Hemangiomas - Red  papules - Discussed benign nature - Observe - Call for any changes  Actinic Damage - diffuse scaly erythematous macules with underlying dyspigmentation - Recommend daily broad spectrum sunscreen SPF 30+ to sun-exposed areas, reapply every 2 hours as needed.  - Call for new or changing lesions.  Skin cancer screening performed today.   History of dysplastic nevus Left upper flank  Clear, observe for changes   Epidermal cyst L upper glabella  Cyst vs Sebaceous Hyperplasia  Start Tazorac 0.05% cr qhs, sample x 1, Lot [id number] 12/22  Seborrheic keratosis Left anterior thigh  Reassured benign age-related growth.  Recommend observation.  Discussed cryotherapy if spot(s) become irritated or inflamed.     Nevus R super pubic area  Benign-appearing.  Observation.  Call clinic for new or changing moles.  Recommend daily use of broad spectrum spf 30+ sunscreen to sun-exposed areas.    Sebaceous hyperplasia face  Benign, observe.    Allergic contact dermatitis, unspecified trigger Lips  ACD vs other, unknown etiology  Start Zyrtec 10 mg 1 po qd Start Aclovate oint qd/bid aa lips prn flares Start Vanicream ointment prn, sample given  Discussed switching toothpaste to Toms Natural toothpaste Discussed patch test if not improving  alclomethasone (ACLOVATE) 0.05 % ointment - Lips  Return in about 1 year (around 06/29/2020) for TBSE, hx of Dysplastic.  I, Othelia Pulling, RMA, am acting as scribe for Brendolyn Patty, MD .  Documentation: I have reviewed the above documentation for accuracy and completeness, and I agree with the above.  Brendolyn Patty MD

## 2019-10-23 DIAGNOSIS — Z01419 Encounter for gynecological examination (general) (routine) without abnormal findings: Secondary | ICD-10-CM | POA: Diagnosis not present

## 2019-10-23 DIAGNOSIS — Z6829 Body mass index (BMI) 29.0-29.9, adult: Secondary | ICD-10-CM | POA: Diagnosis not present

## 2019-10-23 DIAGNOSIS — Z1231 Encounter for screening mammogram for malignant neoplasm of breast: Secondary | ICD-10-CM | POA: Diagnosis not present

## 2020-04-07 ENCOUNTER — Encounter: Payer: Federal, State, Local not specified - PPO | Admitting: Family Medicine

## 2020-04-28 ENCOUNTER — Other Ambulatory Visit: Payer: Self-pay

## 2020-04-28 ENCOUNTER — Ambulatory Visit (INDEPENDENT_AMBULATORY_CARE_PROVIDER_SITE_OTHER): Payer: Federal, State, Local not specified - PPO | Admitting: Family Medicine

## 2020-04-28 ENCOUNTER — Encounter: Payer: Self-pay | Admitting: Family Medicine

## 2020-04-28 VITALS — BP 106/70 | HR 79 | Temp 96.9°F | Ht 65.5 in | Wt 174.2 lb

## 2020-04-28 DIAGNOSIS — Z1211 Encounter for screening for malignant neoplasm of colon: Secondary | ICD-10-CM | POA: Diagnosis not present

## 2020-04-28 DIAGNOSIS — Z0000 Encounter for general adult medical examination without abnormal findings: Secondary | ICD-10-CM | POA: Diagnosis not present

## 2020-04-28 DIAGNOSIS — F5104 Psychophysiologic insomnia: Secondary | ICD-10-CM | POA: Diagnosis not present

## 2020-04-28 NOTE — Assessment & Plan Note (Signed)
Reviewed health habits including diet and exercise and skin cancer prevention Reviewed appropriate screening tests for age  Also reviewed health mt list, fam hx and immunization status , as well as social and family history   See HPI Wellness labs ordered  Missed flu shot this season  covid immunized w/o booster (due to experience with family member she is afraid to get it) Sent for pap report from gyn (was this fall)  Enc to continue derm visits for skin cancer screening

## 2020-04-28 NOTE — Progress Notes (Signed)
Subjective:    Patient ID: Elizabeth Sheppard, female    DOB: [birth year], 50 y.o.   MRN: [id number]  This visit occurred during the SARS-CoV-2 public health emergency.  Safety protocols were in place, including screening questions prior to the visit, additional usage of staff PPE, and extensive cleaning of exam room while observing appropriate contact time as indicated for disinfecting solutions.    HPI Here for health maintenance exam and to review chronic medical problems    Wt Readings from Last 3 Encounters:  04/28/20 174 lb 3 oz (79 kg)  10/22/18 185 lb (83.9 kg)  09/26/17 167 lb (75.8 kg)   28.55 kg/m  Doing well overall  Lost some weight  Exercises on [place] and some strength training  Started opta via 3 wk ago -loves it !    Tdap 2/16 Flu shot- usually gets them/not this year  covid status -had vaccines but not booster She has not had covid  Her FIL had a blood clot after the vaccine    Pap -in the fall  Has had BTL for contraception  Still gets periods fairly regularly  Some night sweats    Mammogram 10/21 Self breast exam-no lumps   Colon cancer screening  Interested in colonoscopy    Skin care screening with Dr Nicole Kindred 6/21 Uses aclovate ointment for dermatitis of lips   Patient Active Problem List   Diagnosis Date Noted  . Routine general medical examination at a health care facility 04/28/2020  . Colon cancer screening 04/28/2020  . Anxiety 01/21/2013  . Chronic insomnia 12/19/2011  . EXTERNAL HEMORRHOIDS 05/11/2008  . NEOPLASM OF UNCERTAIN BEHAVIOR OF SKIN 01/22/2008   Past Medical History:  Diagnosis Date  . Dysplastic nevus 07/09/2017   left upper flank   Past Surgical History:  Procedure Laterality Date  . LAPAROSCOPIC ENDOMETRIOSIS FULGURATION    . TUBAL LIGATION     Social History   Tobacco Use  . Smoking status: Never Smoker  . Smokeless tobacco: Never Used  Substance Use Topics  . Alcohol use: Yes    Comment: occasional  wine  . Drug use: No   Family History  Problem Relation Age of Onset  . Cancer Maternal Grandmother        skin cancer on the vagina  . Cancer Paternal Grandfather        prostate   No Known Allergies Current Outpatient Medications on File Prior to Visit  Medication Sig Dispense Refill  . alclomethasone (ACLOVATE) 0.05 % ointment Apply topically as directed. Qd to bid aa lips prn flares 30 g 1  . ALPRAZolam (XANAX) 0.5 MG tablet Take 0.5 mg by mouth daily as needed for anxiety.    Marland Kitchen zolpidem (AMBIEN CR) 12.5 MG CR tablet TAKE 1 TABLET BY MOUTH AT BEDTIME AS NEEDED FOR SLEEP 30 tablet 1   No current facility-administered medications on file prior to visit.    Review of Systems  Constitutional: Negative for activity change, appetite change, fatigue, fever and unexpected weight change.  HENT: Negative for congestion, ear pain, rhinorrhea, sinus pressure and sore throat.   Eyes: Negative for pain, redness and visual disturbance.  Respiratory: Negative for cough, shortness of breath and wheezing.   Cardiovascular: Negative for chest pain and palpitations.  Gastrointestinal: Negative for abdominal pain, blood in stool, constipation and diarrhea.  Endocrine: Negative for polydipsia and polyuria.  Genitourinary: Negative for dysuria, frequency and urgency.  Musculoskeletal: Negative for arthralgias, back pain and myalgias.  Skin: Negative for  pallor and rash.  Allergic/Immunologic: Negative for environmental allergies.  Neurological: Negative for dizziness, syncope and headaches.  Hematological: Negative for adenopathy. Does not bruise/bleed easily.  Psychiatric/Behavioral: Negative for decreased concentration and dysphoric mood. The patient is not nervous/anxious.        Objective:   Physical Exam Constitutional:      General: She is not in acute distress.    Appearance: Normal appearance. She is well-developed and normal weight. She is not ill-appearing or diaphoretic.  HENT:      Head: Normocephalic and atraumatic.     Right Ear: Tympanic membrane, ear canal and external ear normal.     Left Ear: Tympanic membrane, ear canal and external ear normal.     Nose: Nose normal. No congestion.     Mouth/Throat:     Mouth: Mucous membranes are moist.     Pharynx: Oropharynx is clear. No posterior oropharyngeal erythema.  Eyes:     General: No scleral icterus.    Extraocular Movements: Extraocular movements intact.     Conjunctiva/sclera: Conjunctivae normal.     Pupils: Pupils are equal, round, and reactive to light.  Neck:     Thyroid: No thyromegaly.     Vascular: No carotid bruit or JVD.  Cardiovascular:     Rate and Rhythm: Normal rate and regular rhythm.     Pulses: Normal pulses.     Heart sounds: Normal heart sounds. No gallop.   Pulmonary:     Effort: Pulmonary effort is normal. No respiratory distress.     Breath sounds: Normal breath sounds. No wheezing.     Comments: Good air exch Chest:     Chest wall: No tenderness.  Abdominal:     General: Bowel sounds are normal. There is no distension or abdominal bruit.     Palpations: Abdomen is soft. There is no mass.     Tenderness: There is no abdominal tenderness.     Hernia: No hernia is present.  Genitourinary:    Comments: Breast and pelvic exams are done gyn provider Musculoskeletal:        General: No tenderness. Normal range of motion.     Cervical back: Normal range of motion and neck supple. No rigidity. No muscular tenderness.     Left lower leg: No edema.     Comments: Slight swelling of ankle posterior to R lateral malleolus w/o tenderness  Lymphadenopathy:     Cervical: No cervical adenopathy.  Skin:    General: Skin is warm and dry.     Coloration: Skin is not pale.     Findings: No erythema or rash.     Comments: Solar lentigines diffusely   Neurological:     Mental Status: She is alert. Mental status is at baseline.     Cranial Nerves: No cranial nerve deficit.     Motor: No  abnormal muscle tone.     Coordination: Coordination normal.     Gait: Gait normal.     Deep Tendon Reflexes: Reflexes are normal and symmetric. Reflexes normal.  Psychiatric:        Mood and Affect: Mood normal.        Cognition and Memory: Cognition and memory normal.           Assessment & Plan:   Problem List Items Addressed This Visit      Other   Chronic insomnia    Takes occ ambien or xanax px by gyn provider      Routine general  medical examination at a health care facility - Primary    Reviewed health habits including diet and exercise and skin cancer prevention Reviewed appropriate screening tests for age  Also reviewed health mt list, fam hx and immunization status , as well as social and family history   See HPI Wellness labs ordered  Missed flu shot this season  covid immunized w/o booster (due to experience with family member she is afraid to get it) Sent for pap report from gyn (was this fall)  Enc to continue derm visits for skin cancer screening        Relevant Orders   Comprehensive metabolic panel   CBC with Differential/Platelet   Lipid panel   TSH   Colon cancer screening    Referral made for screening colonoscopy       Relevant Orders   Ambulatory referral to Gastroenterology

## 2020-04-28 NOTE — Patient Instructions (Addendum)
Great job with diet and exercise changes    Take care of yourself  Use sun protection   Labs today for wellness   Stop at check out sign a release to get your pap report

## 2020-04-28 NOTE — Assessment & Plan Note (Signed)
Takes occ ambien or xanax px by gyn provider

## 2020-04-28 NOTE — Assessment & Plan Note (Signed)
Referral made for screening colonoscopy

## 2020-04-29 LAB — LIPID PANEL
Cholesterol: 140 mg/dL (ref 0–200)
HDL: 50.8 mg/dL (ref >39.00)
LDL Cholesterol: 76 mg/dL (ref 0–99)
NonHDL: 88.94
Total CHOL/HDL Ratio: 3
Triglycerides: 66 mg/dL (ref 0.0–149.0)
VLDL: 13.2 mg/dL (ref 0.0–40.0)

## 2020-04-29 LAB — CBC WITH DIFFERENTIAL/PLATELET
Basophils Absolute: 0.1 10*3/uL (ref 0.0–0.1)
Basophils Relative: 0.9 % (ref 0.0–3.0)
Eosinophils Absolute: 0.1 10*3/uL (ref 0.0–0.7)
Eosinophils Relative: 0.8 % (ref 0.0–5.0)
HCT: 42.8 % (ref 36.0–46.0)
Hemoglobin: 14.4 g/dL (ref 12.0–15.0)
Lymphocytes Relative: 25.7 % (ref 12.0–46.0)
Lymphs Abs: 1.8 10*3/uL (ref 0.7–4.0)
MCHC: 33.7 g/dL (ref 30.0–36.0)
MCV: 94.6 fl (ref 78.0–100.0)
Monocytes Absolute: 0.5 10*3/uL (ref 0.1–1.0)
Monocytes Relative: 6.9 % (ref 3.0–12.0)
Neutro Abs: 4.7 10*3/uL (ref 1.4–7.7)
Neutrophils Relative %: 65.7 % (ref 43.0–77.0)
Platelets: 299 10*3/uL (ref 150.0–400.0)
RBC: 4.52 Mil/uL (ref 3.87–5.11)
RDW: 12.1 % (ref 11.5–15.5)
WBC: 7.2 10*3/uL (ref 4.0–10.5)

## 2020-04-29 LAB — TSH: TSH: 2.1 u[IU]/mL (ref 0.35–4.50)

## 2020-04-29 LAB — COMPREHENSIVE METABOLIC PANEL
ALT: 18 U/L (ref 0–35)
AST: 17 U/L (ref 0–37)
Albumin: 4.5 g/dL (ref 3.5–5.2)
Alkaline Phosphatase: 61 U/L (ref 39–117)
BUN: 15 mg/dL (ref 6–23)
CO2: 28 mEq/L (ref 19–32)
Calcium: 10.1 mg/dL (ref 8.4–10.5)
Chloride: 102 mEq/L (ref 96–112)
Creatinine, Ser: 0.7 mg/dL (ref 0.40–1.20)
GFR: 101.28 mL/min (ref >60.00)
Glucose, Bld: 63 mg/dL — ABNORMAL LOW (ref 70–99)
Potassium: 4.7 mEq/L (ref 3.5–5.1)
Sodium: 139 mEq/L (ref 135–145)
Total Bilirubin: 0.3 mg/dL (ref 0.2–1.2)
Total Protein: 7.6 g/dL (ref 6.0–8.3)

## 2020-05-03 ENCOUNTER — Encounter: Payer: Self-pay | Admitting: *Deleted

## 2020-05-10 ENCOUNTER — Other Ambulatory Visit: Payer: Self-pay

## 2020-05-10 ENCOUNTER — Telehealth (INDEPENDENT_AMBULATORY_CARE_PROVIDER_SITE_OTHER): Payer: Self-pay | Admitting: Gastroenterology

## 2020-05-10 DIAGNOSIS — Z1211 Encounter for screening for malignant neoplasm of colon: Secondary | ICD-10-CM

## 2020-05-10 NOTE — Progress Notes (Signed)
Gastroenterology Pre-Procedure Review  Request Date: Monday 07/26/20 Requesting Physician: Dr. Marius Ditch  PATIENT REVIEW QUESTIONS: The patient responded to the following health history questions as indicated:    1. Are you having any GI issues? no 2. Do you have a personal history of Polyps? no 3. Do you have a family history of Colon Cancer or Polyps? no 4. Diabetes Mellitus? no 5. Joint replacements in the past 12 months?no 6. Major health problems in the past 3 months?no 7. Any artificial heart valves, MVP, or defibrillator?no    MEDICATIONS & ALLERGIES:    Patient reports the following regarding taking any anticoagulation/antiplatelet therapy:   Plavix, Coumadin, Eliquis, Xarelto, Lovenox, Pradaxa, Brilinta, or Effient? no Aspirin? no  Patient confirms/reports the following medications:  Current Outpatient Medications  Medication Sig Dispense Refill  . ALPRAZolam (XANAX) 0.5 MG tablet Take 0.5 mg by mouth daily as needed for anxiety.    Marland Kitchen zolpidem (AMBIEN CR) 12.5 MG CR tablet TAKE 1 TABLET BY MOUTH AT BEDTIME AS NEEDED FOR SLEEP 30 tablet 1  . alclomethasone (ACLOVATE) 0.05 % ointment Apply topically as directed. Qd to bid aa lips prn flares (Patient not taking: Reported on 05/10/2020) 30 g 1   No current facility-administered medications for this visit.    Patient confirms/reports the following allergies:  No Known Allergies  No orders of the defined types were placed in this encounter.   AUTHORIZATION INFORMATION Primary Insurance: 1D#: Group #:  Secondary Insurance: 1D#: Group #:  SCHEDULE INFORMATION: Date: 07/26/20 Time: Location:[facility]

## 2020-06-22 ENCOUNTER — Telehealth: Payer: Self-pay

## 2020-06-22 DIAGNOSIS — L259 Unspecified contact dermatitis, unspecified cause: Secondary | ICD-10-CM | POA: Diagnosis not present

## 2020-06-22 NOTE — Telephone Encounter (Signed)
Thanks for the heads up.  Will watch for correspondence

## 2020-06-22 NOTE — Telephone Encounter (Signed)
[facility] - Client TELEPHONE ADVICE RECORD [facility] Patient Name: Elizabeth Sheppard Gender: Female DOB: [birth year] Age: 50 Y 11 M 14 D Return Phone Number: [phone] (Primary) Address: City/ State/ Zip: [place] [address] Client Home Day - Client Client Site [place] MD Contact Type Call Who Is Calling Patient / Member / Family / Caregiver Call Type Triage / Clinical Relationship To Patient Self Return Phone Number [phone] (Primary) Chief Complaint Hives Reason for Call Symptomatic / Request for Health Information Initial Comment PT is having allergic reaction, hives on neck and itchy, face very swollen on left side, eye swollen shut. She took Benadryl last night began yesterday. She is unsure of cause. Translation No Nurse Assessment Nurse: Vallery Sa, RN, Cathy Date/Time (Eastern Time): 06/22/2020 8:52:03 AM Confirm and document reason for call. If symptomatic, describe symptoms. ---Caller states that she developed Hives on her face and neck yesterday. She has swelling of her face and eyes. No severe breathing or swallowing difficulty. No wheezing. No fever. Alert and responsive. Does the patient have any new or worsening symptoms? ---Yes Will a triage be completed? ---Yes Related visit to physician within the last 2 weeks? ---No Does the PT have any chronic conditions? (i.e. diabetes, asthma, this includes High risk factors for pregnancy, etc.) ---No Is the patient pregnant or possibly pregnant? (Ask all females between the ages of 10-55) ---No Is this a behavioral health or substance abuse call? ---No Guidelines Guideline Title Affirmed Question Affirmed Notes Nurse Date/Time (Eastern Time) Hives [1] Hives from food reaction AND [2] diagnosis already confirmed Vallery Sa, RN, [facility] 06/22/2020 8:53:53 AM Face Swelling [1] Painful  rash AND [2] multiple small blisters [place], RN, [facility] [place] 06/22/2020 8:56:31 AM PLEASE NOTE: All timestamps contained within this report are represented as [place] Standard Time. CONFIDENTIALTY NOTICE: This fax transmission is intended only for the addressee. It contains information that is legally privileged, confidential or otherwise protected from use or disclosure. If you are not the intended recipient, you are strictly prohibited from reviewing, disclosing, copying using or disseminating any of this information or taking any action in reliance on or regarding this information. If you have received this fax in error, please notify us immediately by telephone so that we can arrange for its return to [place]. Phone: [phone], Toll-Free: [phone], Fax: [phone] Page: 2 of 3 Call Id: [id number] Guidelines Guideline Title Affirmed Question Affirmed Notes Nurse Date/Time Eilene Ghazi Time) grouped together (i.e., dermatomal distribution or "band" or "stripe") Disp. Time Eilene Ghazi Time) Disposition Final User 06/22/2020 8:56:15 AM Eagle, RN, [facility] [place] 06/22/2020 8:58:11 AM See HCP within 4 Hours (or PCP triage) Yes Vallery Sa, RN, [facility] Disagree/Comply Comply Caller Understands Yes PreDisposition Call Doctor Care Advice Given Per Guideline HOME CARE: * You should be able to treat this at home. FOOD-RELATED HIVES: * Foods can cause transient hives, especially around the mouth. * Some are mild food allergies; others can occur in anyone (e.g., with strawberries). * Hives from foods usually disappear within 6 hours. * LORATADINE (ALAVERT, CLARITIN): The adult dose is 10 mg and you take it once a day. Loratadine is available in the [place] as Engineer, maintenance (IT); it is available in [place] as Claritin. ANTIHISTAMINE MEDICINES FOR HIVES FROM FOOD: * You can take either cetirizine or loratadine for hives caused by food. They can help reduce the rash, itching, and other allergic  symptoms. * Before taking any  medicine, read all the instructions on the package. CALL BACK IF: * Severe hives or severe itching persist over 24 hours despite taking an antihistamine (e.g., Benadryl) * You become worse CARE ADVICE given per Hives (Adult) guideline. SEE HCP (OR PCP TRIAGE) WITHIN 4 HOURS: * IF OFFICE WILL BE OPEN: You need to be seen within the next 3 or 4 hours. Call your doctor (or NP/PA) now or as soon as the office opens. PAIN MEDICINES: * ACETAMINOPHEN - REGULAR STRENGTH TYLENOL: Take 650 mg (two 325 mg pills) by mouth every 4 to 6 hours as needed. Each Regular Strength Tylenol pill has 325 mg of acetaminophen. The most you should take each day is 3,250 mg (10 pills a day). * For pain relief, you can take either acetaminophen, ibuprofen, or naproxen. CALL BACK IF: * You become worse CARE ADVICE given per Face Swelling (Adult) guideline. Comments User: Berton Mount, RN Date/Time Eilene Ghazi Time): 06/22/2020 9:01:34 AM Raquel Sarna via office backline shares they don't have any appointments available and to direct patient to urgent care. [place] notified. Referrals REFERRED TO PCP OFFICE PLEASE NOTE: All timestamps contained within this report are represented as [place] Standard Time. CONFIDENTIALTY NOTICE: This fax transmission is intended only for the addressee. It contains information that is legally privileged, confidential or otherwise protected from use or disclosure. If you are not the intended recipient, you are strictly prohibited from reviewing, disclosing, copying using or disseminating any of this information or taking any action in reliance on or regarding this information. If you have received this fax in error, please notify us immediately by telephone so that we can arrange for its return to [place]. Phone: [phone], Toll-Free: [phone], Fax: [phone] Page: 3 of 3 Call Id: [id number] Referrals GO

## 2020-06-22 NOTE — Telephone Encounter (Signed)
I spoke with pt;pt is presently at [facility] in [place]; pt is not having any difficulty breathing and no swelling in her throat. Pt said face is still swollen but not quite as bad as when spoke with access nurse. Sending FYI to Dr Glori Bickers.

## 2020-07-06 ENCOUNTER — Encounter: Payer: Federal, State, Local not specified - PPO | Admitting: Dermatology

## 2020-07-26 ENCOUNTER — Encounter: Payer: Self-pay | Admitting: Gastroenterology

## 2020-07-26 ENCOUNTER — Ambulatory Visit
Admission: RE | Admit: 2020-07-26 | Discharge: 2020-07-26 | Disposition: A | Discharge status: Home / Self Care | Payer: Federal, State, Local not specified - PPO | Attending: Gastroenterology | Admitting: Gastroenterology

## 2020-07-26 ENCOUNTER — Encounter
Admission: RE | Disposition: A | Discharge status: Home / Self Care | Payer: Self-pay | Source: Home / Self Care | Attending: Gastroenterology

## 2020-07-26 ENCOUNTER — Ambulatory Visit: Payer: Federal, State, Local not specified - PPO | Admitting: Anesthesiology

## 2020-07-26 DIAGNOSIS — D126 Benign neoplasm of colon, unspecified: Secondary | ICD-10-CM | POA: Diagnosis not present

## 2020-07-26 DIAGNOSIS — Z79899 Other long term (current) drug therapy: Secondary | ICD-10-CM | POA: Diagnosis not present

## 2020-07-26 DIAGNOSIS — Z8042 Family history of malignant neoplasm of prostate: Secondary | ICD-10-CM | POA: Diagnosis not present

## 2020-07-26 DIAGNOSIS — K635 Polyp of colon: Secondary | ICD-10-CM | POA: Diagnosis not present

## 2020-07-26 DIAGNOSIS — D122 Benign neoplasm of ascending colon: Secondary | ICD-10-CM | POA: Insufficient documentation

## 2020-07-26 DIAGNOSIS — Z1211 Encounter for screening for malignant neoplasm of colon: Secondary | ICD-10-CM | POA: Diagnosis not present

## 2020-07-26 HISTORY — PX: COLONOSCOPY WITH PROPOFOL: SHX5780

## 2020-07-26 LAB — POCT PREGNANCY, URINE: Preg Test, Ur: NEGATIVE

## 2020-07-26 SURGERY — COLONOSCOPY WITH PROPOFOL
Anesthesia: General

## 2020-07-26 MED ORDER — LIDOCAINE HCL (PF) 2 % IJ SOLN
INTRAMUSCULAR | Status: AC
  Filled 2020-07-26: qty 10

## 2020-07-26 MED ORDER — PROPOFOL 500 MG/50ML IV EMUL
INTRAVENOUS | Status: AC
  Filled 2020-07-26: qty 50

## 2020-07-26 MED ORDER — SODIUM CHLORIDE 0.9 % IV SOLN
INTRAVENOUS | Status: DC
  Administered 2020-07-26: 1000 mL via INTRAVENOUS

## 2020-07-26 MED ORDER — GLYCOPYRROLATE 0.2 MG/ML IJ SOLN
INTRAMUSCULAR | Status: DC | PRN
  Administered 2020-07-26: .2 mg via INTRAVENOUS

## 2020-07-26 MED ORDER — PROPOFOL 500 MG/50ML IV EMUL
INTRAVENOUS | Status: DC | PRN
  Administered 2020-07-26: 140 ug/kg/min via INTRAVENOUS

## 2020-07-26 MED ORDER — LIDOCAINE HCL (CARDIAC) PF 100 MG/5ML IV SOSY
PREFILLED_SYRINGE | INTRAVENOUS | Status: DC | PRN
  Administered 2020-07-26: 50 mg via INTRAVENOUS

## 2020-07-26 MED ORDER — PROPOFOL 10 MG/ML IV BOLUS
INTRAVENOUS | Status: DC | PRN
  Administered 2020-07-26: 10 mg via INTRAVENOUS
  Administered 2020-07-26: 70 mg via INTRAVENOUS

## 2020-07-26 NOTE — H&P (Signed)
Jonathon Bellows, MD [address], [place], [place], [place], [address] [address], [place], [place], [place], [address] Phone: [phone]  Fax: [phone]  Primary Care Physician:  Tower, Wynelle Fanny, MD   Pre-Procedure History & Physical: HPI:  Elizabeth Sheppard is a 50 y.o. female is here for an colonoscopy.   Past Medical History:  Diagnosis Date   Dysplastic nevus 07/09/2017   left upper flank    Past Surgical History:  Procedure Laterality Date   DIAGNOSTIC LAPAROSCOPY     LAPAROSCOPIC ENDOMETRIOSIS FULGURATION     TUBAL LIGATION      Prior to Admission medications   Medication Sig Start Date End Date Taking? Authorizing Provider  ALPRAZolam Duanne Moron) 0.5 MG tablet Take 0.5 mg by mouth daily as needed for anxiety.   Yes [provider]  zolpidem (AMBIEN CR) 12.5 MG CR tablet TAKE 1 TABLET BY MOUTH AT BEDTIME AS NEEDED FOR SLEEP 12/27/12  Yes Tower, Marne A, MD  alclomethasone (ACLOVATE) 0.05 % ointment Apply topically as directed. Qd to bid aa lips prn flares Patient not taking: Reported on 05/10/2020 06/30/19   Brendolyn Patty, MD    Allergies as of 05/10/2020   (No Known Allergies)    Family History  Problem Relation Age of Onset   Cancer Maternal Grandmother        skin cancer on the vagina   Cancer Paternal Grandfather        prostate    Social History   Socioeconomic History   Marital status: Married    Spouse name: Not on file   Number of children: Not on file   Years of education: Not on file   Highest education level: Not on file  Occupational History   Not on file  Tobacco Use   Smoking status: Never   Smokeless tobacco: Never  Vaping Use   Vaping Use: Never used  Substance and Sexual Activity   Alcohol use: Yes    Comment: occasional wine   Drug use: No   Sexual activity: Not on file  Other Topics Concern   Not on file  Social History Narrative   Not on file   Social Determinants of Health   Financial Resource Strain:  Not on file  Food Insecurity: Not on file  Transportation Needs: Not on file  Physical Activity: Not on file  Stress: Not on file  Social Connections: Not on file  Intimate Partner Violence: Not on file    Review of Systems: See HPI, otherwise negative ROS  Physical Exam: BP 113/79   Pulse 68   Temp (!) 96.9 F (36.1 C) (Temporal)   Resp 18   Ht 5\' 6"  (1.676 m)   Wt 68.2 kg   LMP 07/15/2020 Comment: Pregnancy test negative  SpO2 100%   BMI 24.26 kg/m  General:   Alert,  pleasant and cooperative in NAD Head:  Normocephalic and atraumatic. Neck:  Supple; no masses or thyromegaly. Lungs:  Clear throughout to auscultation, normal respiratory effort.    Heart:  +S1, +S2, Regular rate and rhythm, No edema. Abdomen:  Soft, nontender and nondistended. Normal bowel sounds, without guarding, and without rebound.   Neurologic:  Alert and  oriented x4;  grossly normal neurologically.  Impression/Plan: Elizabeth Sheppard is here for an colonoscopy to be performed for Screening colonoscopy average risk   Risks, benefits, limitations, and alternatives regarding  colonoscopy have been reviewed with the patient.  Questions have been answered.  All  parties agreeable.   Jonathon Bellows, MD  07/26/2020, 11:36 AM

## 2020-07-26 NOTE — Anesthesia Preprocedure Evaluation (Signed)
Anesthesia Evaluation  Patient identified by MRN, date of birth, ID band Patient awake    Reviewed: Allergy & Precautions, NPO status , Patient's Chart, lab work & pertinent test results  History of Anesthesia Complications Negative for: history of anesthetic complications  Airway Mallampati: II  TM Distance: >3 FB Neck ROM: Full    Dental no notable dental hx.    Pulmonary neg pulmonary ROS, neg sleep apnea, neg COPD,    breath sounds clear to auscultation- rhonchi (-) wheezing      Cardiovascular Exercise Tolerance: Good (-) hypertension(-) CAD and (-) Past MI  Rhythm:Regular Rate:Normal - Systolic murmurs and - Diastolic murmurs    Neuro/Psych Anxiety negative neurological ROS     GI/Hepatic negative GI ROS, Neg liver ROS,   Endo/Other  negative endocrine ROSneg diabetes  Renal/GU negative Renal ROS     Musculoskeletal negative musculoskeletal ROS (+)   Abdominal (+) - obese,   Peds  Hematology negative hematology ROS (+)   Anesthesia Other Findings Past Medical History: 07/09/2017: Dysplastic nevus     Comment:  left upper flank   Reproductive/Obstetrics                             Anesthesia Physical Anesthesia Plan  ASA: 2  Anesthesia Plan: General   Post-op Pain Management:    Induction: Intravenous  PONV Risk Score and Plan: 2 and Propofol infusion  Airway Management Planned: Natural Airway  Additional Equipment:   Intra-op Plan:   Post-operative Plan:   Informed Consent: I have reviewed the patients History and Physical, chart, labs and discussed the procedure including the risks, benefits and alternatives for the proposed anesthesia with the patient or authorized representative who has indicated his/her understanding and acceptance.     Dental advisory given  Plan Discussed with: CRNA and Anesthesiologist  Anesthesia Plan Comments:          Anesthesia Quick Evaluation

## 2020-07-26 NOTE — Anesthesia Postprocedure Evaluation (Signed)
Anesthesia Post Note  Patient: Elizabeth Sheppard  Procedure(s) Performed: COLONOSCOPY WITH PROPOFOL  Patient location during evaluation: Endoscopy Anesthesia Type: General Level of consciousness: awake and alert and oriented Pain management: pain level controlled Vital Signs Assessment: post-procedure vital signs reviewed and stable Respiratory status: spontaneous breathing, nonlabored ventilation and respiratory function stable Cardiovascular status: blood pressure returned to baseline and stable Postop Assessment: no signs of nausea or vomiting Anesthetic complications: no   No notable events documented.   Last Vitals:  Vitals:   07/26/20 1302 07/26/20 1310  BP: 117/77 110/70  Pulse: (!) 55   Resp: 16 20  Temp:    SpO2:      Last Pain:  Vitals:   07/26/20 1310  TempSrc:   PainSc: 0-No pain                 Kemond Amorin

## 2020-07-26 NOTE — Transfer of Care (Signed)
Immediate Anesthesia Transfer of Care Note  Patient: Elizabeth Sheppard  Procedure(s) Performed: COLONOSCOPY WITH PROPOFOL  Patient Location: PACU and Endoscopy Unit  Anesthesia Type:General  Level of Consciousness: awake, drowsy and patient cooperative  Airway & Oxygen Therapy: Patient Spontanous Breathing  Post-op Assessment: Report given to RN and Post -op Vital signs reviewed and stable  Post vital signs: Reviewed and stable  Last Vitals:  Vitals Value Taken Time  BP 104/65 07/26/20 1256  Temp 36.8 C 07/26/20 1252  Pulse 70 07/26/20 1257  Resp 25 07/26/20 1257  SpO2 100 % 07/26/20 1257  Vitals shown include unvalidated device data.  Last Pain:  Vitals:   07/26/20 1252  TempSrc: Temporal  PainSc: 0-No pain         Complications: No notable events documented.

## 2020-07-26 NOTE — Op Note (Signed)
[facility] Gastroenterology Patient Name: Elizabeth Sheppard Procedure Date: 07/26/2020 12:24 PM MRN: [id number] Account #: [id number] Date of Birth: Jun 03, 1970 Admit Type: Outpatient Age: 50 Room: [facility] ENDO ROOM 1 Gender: Female Note Status: Finalized Procedure:             Colonoscopy Indications:           Screening for colorectal malignant neoplasm Providers:             Jonathon Bellows MD, MD Referring MD:          Wynelle Fanny. Tower (Referring MD) Medicines:             Monitored Anesthesia Care Complications:         No immediate complications. Procedure:             Pre-Anesthesia Assessment:                        - Prior to the procedure, a History and Physical was                         performed, and patient medications, allergies and                         sensitivities were reviewed. The patient's tolerance                         of previous anesthesia was reviewed.                        - The risks and benefits of the procedure and the                         sedation options and risks were discussed with the                         patient. All questions were answered and informed                         consent was obtained.                        - ASA Grade Assessment: II - A patient with mild                         systemic disease.                        After obtaining informed consent, the colonoscope was                         passed under direct vision. Throughout the procedure,                         the patient's blood pressure, pulse, and oxygen                         saturations were monitored continuously. The                         Colonoscope was introduced through the anus and  advanced to the the cecum, identified by the                         appendiceal orifice. The colonoscopy was performed                         with ease. The patient tolerated the procedure well.                         The quality  of the bowel preparation was excellent. Findings:      The perianal and digital rectal examinations were normal.      A 5 mm polyp was found in the ascending colon. The polyp was sessile.       The polyp was removed with a cold snare. Resection and retrieval were       complete.      The exam was otherwise without abnormality on direct and retroflexion       views. Impression:            - One 5 mm polyp in the ascending colon, removed with                         a cold snare. Resected and retrieved.                        - The examination was otherwise normal on direct and                         retroflexion views. Recommendation:        - Discharge patient to home (with escort).                        - Resume previous diet.                        - Continue present medications.                        - Await pathology results.                        - Repeat colonoscopy for surveillance based on                         pathology results. Procedure Code(s):     --- Professional ---                        [id number], Colonoscopy, flexible; with removal of                         tumor(s), polyp(s), or other lesion(s) by snare                         technique Diagnosis Code(s):     --- Professional ---                        Z12.11, Encounter for screening for malignant neoplasm  of colon                        K63.5, Polyp of colon CPT copyright 2019 [employer] Association. All rights reserved. The codes documented in this report are preliminary and upon coder review may  be revised to meet current compliance requirements. Jonathon Bellows, MD Jonathon Bellows MD, MD 07/26/2020 12:49:42 PM This report has been signed electronically. Number of Addenda: 0 Note Initiated On: 07/26/2020 12:24 PM Scope Withdrawal Time: 0 hours 8 minutes 23 seconds  Total Procedure Duration: 0 hours 12 minutes 2 seconds  Estimated Blood Loss:  Estimated blood loss: none.      [facility]

## 2020-07-26 NOTE — Anesthesia Procedure Notes (Signed)
Date/Time: 07/26/2020 12:28 PM Performed by: Lowry Bowl, CRNA Pre-anesthesia Checklist: Patient identified, Emergency Drugs available, Suction available, Patient being monitored and Timeout performed Patient Re-evaluated:Patient Re-evaluated prior to induction Oxygen Delivery Method: Simple face mask Induction Type: IV induction Dental Injury: Teeth and Oropharynx as per pre-operative assessment

## 2020-07-27 ENCOUNTER — Encounter: Payer: Self-pay | Admitting: Gastroenterology

## 2020-07-27 LAB — SURGICAL PATHOLOGY

## 2020-07-27 NOTE — Progress Notes (Signed)
ok 

## 2020-09-06 ENCOUNTER — Ambulatory Visit: Payer: Federal, State, Local not specified - PPO | Admitting: Dermatology

## 2020-09-06 ENCOUNTER — Other Ambulatory Visit: Payer: Self-pay

## 2020-09-06 DIAGNOSIS — Z86018 Personal history of other benign neoplasm: Secondary | ICD-10-CM

## 2020-09-06 DIAGNOSIS — L570 Actinic keratosis: Secondary | ICD-10-CM | POA: Diagnosis not present

## 2020-09-06 DIAGNOSIS — L814 Other melanin hyperpigmentation: Secondary | ICD-10-CM | POA: Diagnosis not present

## 2020-09-06 DIAGNOSIS — L578 Other skin changes due to chronic exposure to nonionizing radiation: Secondary | ICD-10-CM | POA: Diagnosis not present

## 2020-09-06 DIAGNOSIS — D1800 Hemangioma unspecified site: Secondary | ICD-10-CM

## 2020-09-06 DIAGNOSIS — Z1283 Encounter for screening for malignant neoplasm of skin: Secondary | ICD-10-CM

## 2020-09-06 DIAGNOSIS — D485 Neoplasm of uncertain behavior of skin: Secondary | ICD-10-CM | POA: Diagnosis not present

## 2020-09-06 DIAGNOSIS — L821 Other seborrheic keratosis: Secondary | ICD-10-CM

## 2020-09-06 DIAGNOSIS — D1809 Hemangioma of other sites: Secondary | ICD-10-CM | POA: Diagnosis not present

## 2020-09-06 DIAGNOSIS — D229 Melanocytic nevi, unspecified: Secondary | ICD-10-CM

## 2020-09-06 DIAGNOSIS — D492 Neoplasm of unspecified behavior of bone, soft tissue, and skin: Secondary | ICD-10-CM

## 2020-09-06 NOTE — Patient Instructions (Addendum)
Wound Care Instructions  Cleanse wound gently with soap and water once a day then pat dry with clean gauze. Apply a thing coat of Petrolatum (petroleum jelly, "Vaseline") over the wound (unless you have an allergy to this). We recommend that you use a new, sterile tube of Vaseline. Do not pick or remove scabs. Do not remove the yellow or white "healing tissue" from the base of the wound.  Cover the wound with fresh, clean, nonstick gauze and secure with paper tape. You may use Band-Aids in place of gauze and tape if the would is small enough, but would recommend trimming much of the tape off as there is often too much. Sometimes Band-Aids can irritate the skin.  You should call the office for your biopsy report after 1 week if you have not already been contacted.  If you experience any problems, such as abnormal amounts of bleeding, swelling, significant bruising, significant pain, or evidence of infection, please call the office immediately.  FOR ADULT SURGERY PATIENTS: If you need something for pain relief you may take 1 extra strength Tylenol (acetaminophen) AND 2 Ibuprofen (200mg each) together every 4 hours as needed for pain. (do not take these if you are allergic to them or if you have a reason you should not take them.) Typically, you may only need pain medication for 1 to 3 days.   If you have any questions or concerns for your doctor, please call our main line at [phone] and press option 4 to reach your doctor's medical assistant. If no one answers, please leave a voicemail as directed and we will return your call as soon as possible. Messages left after 4 pm will be answered the following business day.   You may also send us a message via MyChart. We typically respond to MyChart messages within 1-2 business days.  For prescription refills, please ask your pharmacy to contact our office. Our fax number is [phone].  If you have an urgent issue when the clinic is closed that  cannot wait until the next business day, you can page your doctor at the number below.    Please note that while we do our best to be available for urgent issues outside of office hours, we are not available 24/7.   If you have an urgent issue and are unable to reach us, you may choose to seek medical care at your doctor's office, retail clinic, urgent care center, or emergency room.  If you have a medical emergency, please immediately call 911 or go to the emergency department.  Pager Numbers  - Dr. Kowalski: [phone]  - Dr. Moye: [phone]  - Dr. Stewart: [phone]  In the event of inclement weather, please call our main line at [phone] for an update on the status of any delays or closures.  Dermatology Medication Tips: Please keep the boxes that topical medications come in in order to help keep track of the instructions about where and how to use these. Pharmacies typically print the medication instructions only on the boxes and not directly on the medication tubes.   If your medication is too expensive, please contact our office at [phone] option 4 or send us a message through MyChart.   We are unable to tell what your co-pay for medications will be in advance as this is different depending on your insurance coverage. However, we may be able to find a substitute medication at lower cost or fill out paperwork to get insurance to cover a needed   medication.   If a prior authorization is required to get your medication covered by your insurance company, please allow us 1-2 business days to complete this process.  Drug prices often vary depending on where the prescription is filled and some pharmacies may offer cheaper prices.  The website www.goodrx.com contains coupons for medications through different pharmacies. The prices here do not account for what the cost may be with help from insurance (it may be cheaper with your insurance), but the website can give you the  price if you did not use any insurance.  - You can print the associated coupon and take it with your prescription to the pharmacy.  - You may also stop by our office during regular business hours and pick up a GoodRx coupon card.  - If you need your prescription sent electronically to a different pharmacy, notify our office through [facility] MyChart or by phone at [phone] option 4.   

## 2020-09-06 NOTE — Progress Notes (Signed)
Follow-Up Visit   Subjective  Elizabeth Sheppard is a 50 y.o. female who presents for the following: Total body skin exam (Hx of Dysplastic nevus) and check spot (Face, some flaky).  She has a bump on her forehead which has been there over a year that gets irritated and that she picks at.  She would like it removed.   The following portions of the chart were reviewed this encounter and updated as appropriate:       Review of Systems:  No other skin or systemic complaints except as noted in HPI or Assessment and Plan.  Objective  Well appearing patient in no apparent distress; mood and affect are within normal limits.  A full examination was performed including scalp, head, eyes, ears, nose, lips, neck, chest, axillae, abdomen, back, buttocks, bilateral upper extremities, bilateral lower extremities, hands, feet, fingers, toes, fingernails, and toenails. All findings within normal limits unless otherwise noted below.  L glabella 4.27m pink flesh firm pap     mid forehead x 1, L upper forehead x 3 (4) Pink scaly macules   R hand dorsum 6.057mmed light brown macule darker center, no changes per pt  L ant thigh 9.77m65maxy tan macule   Assessment & Plan   Lentigines - Scattered tan macules - Due to sun exposure - Benign-appering, observe - Recommend daily broad spectrum sunscreen SPF 30+ to sun-exposed areas, reapply every 2 hours as needed. - Call for any changes  Seborrheic Keratoses - Stuck-on, waxy, tan-brown papules and/or plaques  - Benign-appearing - Discussed benign etiology and prognosis. - Observe - Call for any changes - back  Melanocytic Nevi - Tan-brown and/or pink-flesh-colored symmetric macules and papules - Benign appearing on exam today - Observation - Call clinic for new or changing moles - Recommend daily use of broad spectrum spf 30+ sunscreen to sun-exposed areas.  - back, arms  Hemangiomas - Red papules - Discussed benign nature -  Observe - Call for any changes - abdomen, legs (one is irritated on R thigh from picking- avoid picking)  Actinic Damage - Chronic condition, secondary to cumulative UV/sun exposure - diffuse scaly erythematous macules with underlying dyspigmentation - Recommend daily broad spectrum sunscreen SPF 30+ to sun-exposed areas, reapply every 2 hours as needed.  - Staying in the shade or wearing long sleeves, sun glasses (UVA+UVB protection) and wide brim hats (4-inch brim around the entire circumference of the hat) are also recommended for sun protection.  - Call for new or changing lesions. - chest  Skin cancer screening performed today.  History of Dysplastic Nevi - No evidence of recurrence today - Recommend regular full body skin exams - Recommend daily broad spectrum sunscreen SPF 30+ to sun-exposed areas, reapply every 2 hours as needed.  - Call if any new or changing lesions are noted between office visits  - L upper flank  Neoplasm of skin L glabella  Epidermal / dermal shaving  Lesion diameter (cm):  0.5 Informed consent: discussed and consent obtained   Patient was prepped and draped in usual sterile fashion: area prepped with alcohol. Anesthesia: the lesion was anesthetized in a standard fashion   Anesthetic:  1% lidocaine w/ epinephrine 1-100,000 buffered w/ 8.4% NaHCO3 Instrument used: flexible razor blade   Hemostasis achieved with: pressure, aluminum chloride and electrodesiccation   Outcome: patient tolerated procedure well   Post-procedure details: wound care instructions given   Post-procedure details comment:  Ointment and small bandage applied  Specimen 1 - Surgical  pathology Differential Diagnosis: D48.5 Irritated nevus vs Irritated sebaceous hyperplasia, r/o BCC Check Margins: No 4.2m pink flesh firm pap  Discussed resulting small scar with shave removal, and possible recurrence of lesion.  Recommend vaseline ointment to area daily and cover until healed.   Recommend photoprotection/sunscreen to area to prevent discoloration of scar.  Once healed, may apply OTC Serica scar gel bid to thickened scars.   AK (actinic keratosis) (4) mid forehead x 1, L upper forehead x 3  Destruction of lesion - mid forehead x 1, L upper forehead x 3  Destruction method: cryotherapy   Informed consent: discussed and consent obtained   Lesion destroyed using liquid nitrogen: Yes   Region frozen until ice ball extended beyond lesion: Yes   Outcome: patient tolerated procedure well with no complications   Post-procedure details: wound care instructions given   Additional details:  Prior to procedure, discussed risks of blister formation, small wound, skin dyspigmentation, or rare scar following cryotherapy. Recommend Vaseline ointment to treated areas while healing.   Lentigines R hand dorsum  Benign-appearing.  Observation.  Call clinic for new or changing moles.  Recommend daily use of broad spectrum spf 30+ sunscreen to sun-exposed areas.    Seborrheic keratosis L ant thigh  Benign-appearing.  Observation.  Call clinic for new or changing moles.  Recommend daily use of broad spectrum spf 30+ sunscreen to sun-exposed areas.    Return in about 1 year (around 09/06/2021) for TBSE, Hx of Dysplastic nevi, Hx of AKs.  I, SOthelia Pulling RMA, am acting as scribe for TBrendolyn Patty MD .  Documentation: I have reviewed the above documentation for accuracy and completeness, and I agree with the above.  TBrendolyn PattyMD

## 2020-09-13 ENCOUNTER — Telehealth: Payer: Self-pay

## 2020-09-13 NOTE — Telephone Encounter (Signed)
Advised pt of bx result/sh ?

## 2020-09-13 NOTE — Telephone Encounter (Signed)
-----   Message from Brendolyn Patty, MD sent at 09/13/2020  9:07 AM EDT ----- Skin , left glabella ANGIOMA, ULCERATED  Benign - please call patient

## 2020-12-22 DIAGNOSIS — Z01419 Encounter for gynecological examination (general) (routine) without abnormal findings: Secondary | ICD-10-CM | POA: Diagnosis not present

## 2020-12-22 DIAGNOSIS — Z6824 Body mass index (BMI) 24.0-24.9, adult: Secondary | ICD-10-CM | POA: Diagnosis not present

## 2020-12-22 DIAGNOSIS — Z124 Encounter for screening for malignant neoplasm of cervix: Secondary | ICD-10-CM | POA: Diagnosis not present

## 2020-12-22 DIAGNOSIS — Z1231 Encounter for screening mammogram for malignant neoplasm of breast: Secondary | ICD-10-CM | POA: Diagnosis not present

## 2020-12-22 LAB — HM MAMMOGRAPHY

## 2021-01-06 ENCOUNTER — Encounter: Payer: Self-pay | Admitting: Family Medicine

## 2021-09-02 ENCOUNTER — Telehealth: Payer: Self-pay | Admitting: Family Medicine

## 2021-09-02 DIAGNOSIS — Z1379 Encounter for other screening for genetic and chromosomal anomalies: Secondary | ICD-10-CM | POA: Diagnosis not present

## 2021-09-02 DIAGNOSIS — R922 Inconclusive mammogram: Secondary | ICD-10-CM | POA: Diagnosis not present

## 2021-09-02 DIAGNOSIS — Z9189 Other specified personal risk factors, not elsewhere classified: Secondary | ICD-10-CM | POA: Diagnosis not present

## 2021-09-02 NOTE — Telephone Encounter (Signed)
[place] records have been received. Thank you!

## 2021-09-12 ENCOUNTER — Ambulatory Visit: Payer: Federal, State, Local not specified - PPO | Admitting: Dermatology

## 2021-09-26 DIAGNOSIS — Z1379 Encounter for other screening for genetic and chromosomal anomalies: Secondary | ICD-10-CM | POA: Diagnosis not present

## 2021-09-26 DIAGNOSIS — F419 Anxiety disorder, unspecified: Secondary | ICD-10-CM | POA: Diagnosis not present

## 2021-10-03 DIAGNOSIS — Z1379 Encounter for other screening for genetic and chromosomal anomalies: Secondary | ICD-10-CM | POA: Diagnosis not present

## 2021-10-21 DIAGNOSIS — Z1239 Encounter for other screening for malignant neoplasm of breast: Secondary | ICD-10-CM | POA: Diagnosis not present

## 2021-10-21 DIAGNOSIS — Z803 Family history of malignant neoplasm of breast: Secondary | ICD-10-CM | POA: Diagnosis not present

## 2021-10-21 DIAGNOSIS — R9230 Dense breasts, unspecified: Secondary | ICD-10-CM | POA: Diagnosis not present

## 2021-10-21 DIAGNOSIS — Z9189 Other specified personal risk factors, not elsewhere classified: Secondary | ICD-10-CM | POA: Diagnosis not present

## 2021-10-21 DIAGNOSIS — Z1501 Genetic susceptibility to malignant neoplasm of breast: Secondary | ICD-10-CM | POA: Diagnosis not present

## 2021-10-21 DIAGNOSIS — Z1509 Genetic susceptibility to other malignant neoplasm: Secondary | ICD-10-CM | POA: Diagnosis not present

## 2021-10-24 DIAGNOSIS — Z1501 Genetic susceptibility to malignant neoplasm of breast: Secondary | ICD-10-CM | POA: Diagnosis not present

## 2021-10-24 DIAGNOSIS — Z1509 Genetic susceptibility to other malignant neoplasm: Secondary | ICD-10-CM | POA: Diagnosis not present

## 2021-10-24 DIAGNOSIS — Z8042 Family history of malignant neoplasm of prostate: Secondary | ICD-10-CM | POA: Diagnosis not present

## 2021-12-05 DIAGNOSIS — Z1379 Encounter for other screening for genetic and chromosomal anomalies: Secondary | ICD-10-CM | POA: Diagnosis not present

## 2021-12-05 DIAGNOSIS — Z421 Encounter for breast reconstruction following mastectomy: Secondary | ICD-10-CM | POA: Diagnosis not present

## 2021-12-07 DIAGNOSIS — N393 Stress incontinence (female) (male): Secondary | ICD-10-CM | POA: Diagnosis not present

## 2021-12-21 DIAGNOSIS — Z1502 Genetic susceptibility to malignant neoplasm of ovary: Secondary | ICD-10-CM | POA: Diagnosis not present

## 2021-12-21 DIAGNOSIS — N8003 Adenomyosis of the uterus: Secondary | ICD-10-CM | POA: Diagnosis not present

## 2021-12-21 DIAGNOSIS — N8301 Follicular cyst of right ovary: Secondary | ICD-10-CM | POA: Diagnosis not present

## 2021-12-21 DIAGNOSIS — Z1509 Genetic susceptibility to other malignant neoplasm: Secondary | ICD-10-CM | POA: Diagnosis not present

## 2021-12-21 DIAGNOSIS — Z1501 Genetic susceptibility to malignant neoplasm of breast: Secondary | ICD-10-CM | POA: Diagnosis not present

## 2021-12-21 DIAGNOSIS — F5104 Psychophysiologic insomnia: Secondary | ICD-10-CM | POA: Diagnosis not present

## 2021-12-21 DIAGNOSIS — N8302 Follicular cyst of left ovary: Secondary | ICD-10-CM | POA: Diagnosis not present

## 2021-12-21 DIAGNOSIS — N393 Stress incontinence (female) (male): Secondary | ICD-10-CM | POA: Diagnosis not present

## 2021-12-21 DIAGNOSIS — N3289 Other specified disorders of bladder: Secondary | ICD-10-CM | POA: Diagnosis not present

## 2021-12-21 DIAGNOSIS — Z4002 Encounter for prophylactic removal of ovary: Secondary | ICD-10-CM | POA: Diagnosis not present

## 2021-12-21 DIAGNOSIS — Z9851 Tubal ligation status: Secondary | ICD-10-CM | POA: Diagnosis not present

## 2021-12-21 DIAGNOSIS — N809 Endometriosis, unspecified: Secondary | ICD-10-CM | POA: Diagnosis not present

## 2021-12-21 DIAGNOSIS — N838 Other noninflammatory disorders of ovary, fallopian tube and broad ligament: Secondary | ICD-10-CM | POA: Diagnosis not present

## 2022-01-23 DIAGNOSIS — Z90710 Acquired absence of both cervix and uterus: Secondary | ICD-10-CM | POA: Diagnosis not present

## 2022-01-23 DIAGNOSIS — Z1509 Genetic susceptibility to other malignant neoplasm: Secondary | ICD-10-CM | POA: Diagnosis not present

## 2022-01-23 DIAGNOSIS — Z79890 Hormone replacement therapy (postmenopausal): Secondary | ICD-10-CM | POA: Diagnosis not present

## 2022-01-23 DIAGNOSIS — Z8742 Personal history of other diseases of the female genital tract: Secondary | ICD-10-CM | POA: Diagnosis not present

## 2022-01-23 DIAGNOSIS — Z1379 Encounter for other screening for genetic and chromosomal anomalies: Secondary | ICD-10-CM | POA: Diagnosis not present

## 2022-01-23 DIAGNOSIS — Z90722 Acquired absence of ovaries, bilateral: Secondary | ICD-10-CM | POA: Diagnosis not present

## 2022-03-21 ENCOUNTER — Ambulatory Visit: Payer: Federal, State, Local not specified - PPO | Admitting: Dermatology

## 2022-03-21 ENCOUNTER — Encounter: Payer: Self-pay | Admitting: Dermatology

## 2022-03-21 VITALS — BP 121/72 | HR 70

## 2022-03-21 DIAGNOSIS — D239 Other benign neoplasm of skin, unspecified: Secondary | ICD-10-CM

## 2022-03-21 DIAGNOSIS — L821 Other seborrheic keratosis: Secondary | ICD-10-CM | POA: Diagnosis not present

## 2022-03-21 DIAGNOSIS — Z1283 Encounter for screening for malignant neoplasm of skin: Secondary | ICD-10-CM

## 2022-03-21 DIAGNOSIS — L820 Inflamed seborrheic keratosis: Secondary | ICD-10-CM

## 2022-03-21 DIAGNOSIS — L814 Other melanin hyperpigmentation: Secondary | ICD-10-CM | POA: Diagnosis not present

## 2022-03-21 DIAGNOSIS — D2362 Other benign neoplasm of skin of left upper limb, including shoulder: Secondary | ICD-10-CM

## 2022-03-21 DIAGNOSIS — D225 Melanocytic nevi of trunk: Secondary | ICD-10-CM

## 2022-03-21 DIAGNOSIS — D2262 Melanocytic nevi of left upper limb, including shoulder: Secondary | ICD-10-CM

## 2022-03-21 DIAGNOSIS — D1801 Hemangioma of skin and subcutaneous tissue: Secondary | ICD-10-CM

## 2022-03-21 DIAGNOSIS — D229 Melanocytic nevi, unspecified: Secondary | ICD-10-CM

## 2022-03-21 NOTE — Progress Notes (Unsigned)
Follow-Up Visit   Subjective  Elizabeth Sheppard is a 52 y.o. female who presents for the following: Annual Exam.  The patient presents for Total-Body Skin Exam (TBSE) for skin cancer screening and mole check.  The patient has spots, moles and lesions to be evaluated, some may be new or changing. She has a spot on her shoulder that itches. 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 Patient picks at this area and would like to discuss further removal. Patient has a history of Aks of the face and history of dysplastic nevus of the left upper flank. She was recently diagnosed BRCA2 positive. She had a hysterectomy in Dec 2023 and will be having a double mastectomy in August 2024.  The following portions of the chart were reviewed this encounter and updated as appropriate:       Review of Systems:  No other skin or systemic complaints except as noted in HPI or Assessment and Plan.  Objective  Well appearing patient in no apparent distress; mood and affect are within normal limits.  A full examination was performed including scalp, head, eyes, ears, nose, lips, neck, chest, axillae, abdomen, back, buttocks, bilateral upper extremities, bilateral lower extremities, hands, feet, fingers, toes, fingernails, and toenails. All findings within normal limits unless otherwise noted below.  Right Dorsal Hand 6.25m med light brown macule darker center, no changes per pt   Left Anterior Thigh 1.0 CM waxy tan macule   L medial posterior shoulder Erythematous stuck-on, waxy papule  R super pubic area, L upper arm above elbow Right super pubic: 8.057mpink brown pap  Left upper arm above elbow: 4.0 mm tan papule with gray blue macule at edge  L post upper arm 2.0 mm gray blue papule  Left Glabella Pink/flesh crusted papule    Assessment & Plan  Skin cancer screening performed today.  Actinic Damage - chronic, secondary to cumulative UV radiation  exposure/sun exposure over time - diffuse scaly erythematous macules with underlying dyspigmentation - Recommend daily broad spectrum sunscreen SPF 30+ to sun-exposed areas, reapply every 2 hours as needed.  - Recommend staying in the shade or wearing long sleeves, sun glasses (UVA+UVB protection) and wide brim hats (4-inch brim around the entire circumference of the hat). - Call for new or changing lesions.  Lentigines - Scattered tan macules - Due to sun exposure - Benign-appearing, observe - Recommend daily broad spectrum sunscreen SPF 30+ to sun-exposed areas, reapply every 2 hours as needed. - Call for any changes  Seborrheic Keratoses - Stuck-on, waxy, tan-brown papules and/or plaques  - Benign-appearing - Discussed benign etiology and prognosis. - Observe - Call for any changes  Melanocytic Nevi - Tan-brown and/or pink-flesh-colored symmetric macules and papules - Benign appearing on exam today - Observation - Call clinic for new or changing moles - Recommend daily use of broad spectrum spf 30+ sunscreen to sun-exposed areas.   Lentigo Right Dorsal Hand  Benign-appearing, stable.  Observation.  Call clinic for new or changing lesions.  Recommend daily use of broad spectrum spf 30+ sunscreen to sun-exposed areas.    Seborrheic keratosis Left Anterior Thigh  Benign-appearing, stable.  Observation.  Call clinic for new or changing lesions.  Recommend daily use of broad spectrum spf 30+ sunscreen to sun-exposed areas.    Inflamed seborrheic keratosis L medial posterior shoulder  Symptomatic, irritating, patient would like treated.  Destruction of lesion - L medial posterior shoulder  Destruction method: cryotherapy  Informed consent: discussed and consent obtained   Lesion destroyed using liquid nitrogen: Yes   Region frozen until ice ball extended beyond lesion: Yes   Outcome: patient tolerated procedure well with no complications   Post-procedure details:  wound care instructions given   Additional details:  Prior to procedure, discussed risks of blister formation, small wound, skin dyspigmentation, or rare scar following cryotherapy. Recommend Vaseline ointment to treated areas while healing.   Nevus R super pubic area, L upper arm above elbow  Benign-appearing.  Observation.  Call clinic for new or changing moles.  Recommend daily use of broad spectrum spf 30+ sunscreen to sun-exposed areas.   Blue nevus L post upper arm  Benign-appearing. Stable compared to previous visit. Observation.  Call clinic for new or changing moles.  Recommend daily use of broad spectrum spf 30+ sunscreen to sun-exposed areas.    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 Patient picks at.    Destruction of lesion - Left Glabella  Destruction method: electrodesiccation and curettage   Destruction method comment:  Electrodesiccation only not curretage Informed consent: discussed and consent obtained   Patient was prepped and draped in usual sterile fashion: patient was prepped with isopropyl alcohol. Anesthesia: the lesion was anesthetized in a standard fashion   Anesthetic:  1% lidocaine w/ epinephrine 1-100,000 local infiltration Hemostasis achieved with:  electrodesiccation Outcome: patient tolerated procedure well with no complications   Post-procedure details: wound care instructions given     Return in about 1 year (around 03/21/2023) for TBSE, Hx Dysplastic Nevus.  IJamesetta Orleans, CMA, am acting as scribe for Brendolyn Patty, MD .   Documentation: I have reviewed the above documentation for accuracy and completeness, and I agree with the above.  Brendolyn Patty MD

## 2022-03-21 NOTE — Patient Instructions (Addendum)
Cryotherapy Aftercare  Wash gently with soap and water everyday.   Apply Vaseline and Band-Aid daily until healed.   Melanoma ABCDEs  Melanoma is the most dangerous type of skin cancer, and is the leading cause of death from skin disease.  You are more likely to develop melanoma if you: Have light-colored skin, light-colored eyes, or red or blond hair Spend a lot of time in the sun Tan regularly, either outdoors or in a tanning bed Have had blistering sunburns, especially during childhood Have a close family member who has had a melanoma Have atypical moles or large birthmarks  Early detection of melanoma is key since treatment is typically straightforward and cure rates are extremely high if we catch it early.   The first sign of melanoma is often a change in a mole or a new dark spot.  The ABCDE system is a way of remembering the signs of melanoma.  A for asymmetry:  The two halves do not match. B for border:  The edges of the growth are irregular. C for color:  A mixture of colors are present instead of an even brown color. D for diameter:  Melanomas are usually (but not always) greater than 6mm - the size of a pencil eraser. E for evolution:  The spot keeps changing in size, shape, and color.  Please check your skin once per month between visits. You can use a small mirror in front and a large mirror behind you to keep an eye on the back side or your body.   If you see any new or changing lesions before your next follow-up, please call to schedule a visit.  Please continue daily skin protection including broad spectrum sunscreen SPF 30+ to sun-exposed areas, reapplying every 2 hours as needed when you're outdoors.   Staying in the shade or wearing long sleeves, sun glasses (UVA+UVB protection) and wide brim hats (4-inch brim around the entire circumference of the hat) are also recommended for sun protection.      Due to recent changes in healthcare laws, you may see results of  your pathology and/or laboratory studies on MyChart before the doctors have had a chance to review them. We understand that in some cases there may be results that are confusing or concerning to you. Please understand that not all results are received at the same time and often the doctors may need to interpret multiple results in order to provide you with the best plan of care or course of treatment. Therefore, we ask that you please give us 2 business days to thoroughly review all your results before contacting the office for clarification. Should we see a critical lab result, you will be contacted sooner.   If You Need Anything After Your Visit  If you have any questions or concerns for your doctor, please call our main line at [phone] and press option 4 to reach your doctor's medical assistant. If no one answers, please leave a voicemail as directed and we will return your call as soon as possible. Messages left after 4 pm will be answered the following business day.   You may also send us a message via MyChart. We typically respond to MyChart messages within 1-2 business days.  For prescription refills, please ask your pharmacy to contact our office. Our fax number is [phone].  If you have an urgent issue when the clinic is closed that cannot wait until the next business day, you can page your doctor at the number   below.    Please note that while we do our best to be available for urgent issues outside of office hours, we are not available 24/7.   If you have an urgent issue and are unable to reach [place], you may choose to seek medical care at your doctor's office, retail clinic, urgent care center, or emergency room.  If you have a medical emergency, please immediately call 911 or go to the emergency department.  Pager Numbers  - Dr. Nehemiah Massed: [phone]  - Dr. Laurence Ferrari: [phone]  - Dr. Nicole Kindred: [phone]  In the event of inclement weather, please call our main line at  [phone] for an update on the status of any delays or closures.  Dermatology Medication Tips: Please keep the boxes that topical medications come in in order to help keep track of the instructions about where and how to use these. Pharmacies typically print the medication instructions only on the boxes and not directly on the medication tubes.   If your medication is too expensive, please contact our office at [phone] option 4 or send [place] a message through [place].   We are unable to tell what your co-pay for medications will be in advance as this is different depending on your insurance coverage. However, we may be able to find a substitute medication at lower cost or fill out paperwork to get insurance to cover a needed medication.   If a prior authorization is required to get your medication covered by your insurance company, please allow [place] 1-2 business days to complete this process.  Drug prices often vary depending on where the prescription is filled and some pharmacies may offer cheaper prices.  The website www.goodrx.com contains coupons for medications through different pharmacies. The prices here do not account for what the cost may be with help from insurance (it may be cheaper with your insurance), but the website can give you the price if you did not use any insurance.  - You can print the associated coupon and take it with your prescription to the pharmacy.  - You may also stop by our office during regular business hours and pick up a GoodRx coupon card.  - If you need your prescription sent electronically to a different pharmacy, notify our office through [facility] or by phone at [phone] option 4.     Si Usted Necesita Algo Despus de Su Visita  Tambin puede enviarnos un mensaje a travs de Pharmacist, community. Por lo general respondemos a los mensajes de MyChart en el transcurso de 1 a 2 das hbiles.  Para renovar recetas, por favor pida a su farmacia que se  ponga en contacto con nuestra oficina. Harland Dingwall de fax es [place] [phone].  Si tiene un asunto urgente cuando la clnica est cerrada y que no puede esperar hasta el siguiente da hbil, puede llamar/localizar a su doctor(a) al nmero que aparece a continuacin.   Por favor, tenga en cuenta que aunque hacemos todo lo posible para estar disponibles para asuntos urgentes fuera del horario de [place], no estamos disponibles las 24 horas del da, los 7 das de la [place].   Si tiene un problema urgente y no puede comunicarse con nosotros, puede optar por buscar atencin mdica  en el consultorio de su doctor(a), en una clnica privada, en un centro de atencin urgente o en una sala de emergencias.  Si tiene Engineering geologist, por favor llame inmediatamente al 911 o vaya a la sala de emergencias.  Nmeros de bper  -  Dr. Kowalski: [phone]  - Dra. Moye: [phone]  - Dra. Stewart: [phone]  En caso de inclemencias del tiempo, por favor llame a nuestra lnea principal al [phone] para una actualizacin sobre el estado de cualquier retraso o cierre.  Consejos para la medicacin en dermatologa: Por favor, guarde las cajas en las que vienen los medicamentos de uso tpico para ayudarle a seguir las instrucciones sobre dnde y cmo usarlos. Las farmacias generalmente imprimen las instrucciones del medicamento slo en las cajas y no directamente en los tubos del medicamento.   Si su medicamento es muy caro, por favor, pngase en contacto con nuestra oficina llamando al [phone] y presione la opcin 4 o envenos un mensaje a travs de MyChart.   No podemos decirle cul ser su copago por los medicamentos por adelantado ya que esto es diferente dependiendo de la cobertura de su seguro. Sin embargo, es posible que podamos encontrar un medicamento sustituto a menor costo o llenar un formulario para que el seguro cubra el medicamento que se considera necesario.   Si se requiere  una autorizacin previa para que su compaa de seguros cubra su medicamento, por favor permtanos de 1 a 2 das hbiles para completar este proceso.  Los precios de los medicamentos varan con frecuencia dependiendo del lugar de dnde se surte la receta y alguna farmacias pueden ofrecer precios ms baratos.  El sitio web www.goodrx.com tiene cupones para medicamentos de diferentes farmacias. Los precios aqu no tienen en cuenta lo que podra costar con la ayuda del seguro (puede ser ms barato con su seguro), pero el sitio web puede darle el precio si no utiliz ningn seguro.  - Puede imprimir el cupn correspondiente y llevarlo con su receta a la farmacia.  - Tambin puede pasar por nuestra oficina durante el horario de atencin regular y recoger una tarjeta de cupones de GoodRx.  - Si necesita que su receta se enve electrnicamente a una farmacia diferente, informe a nuestra oficina a travs de MyChart de  o por telfono llamando al [phone] y presione la opcin 4.  

## 2022-03-27 DIAGNOSIS — Z1379 Encounter for other screening for genetic and chromosomal anomalies: Secondary | ICD-10-CM | POA: Diagnosis not present

## 2022-03-27 DIAGNOSIS — Z1501 Genetic susceptibility to malignant neoplasm of breast: Secondary | ICD-10-CM | POA: Diagnosis not present

## 2022-03-27 DIAGNOSIS — Z4001 Encounter for prophylactic removal of breast: Secondary | ICD-10-CM | POA: Diagnosis not present

## 2022-05-01 DIAGNOSIS — Z1509 Genetic susceptibility to other malignant neoplasm: Secondary | ICD-10-CM | POA: Diagnosis not present

## 2022-05-01 DIAGNOSIS — Z1231 Encounter for screening mammogram for malignant neoplasm of breast: Secondary | ICD-10-CM | POA: Diagnosis not present

## 2022-05-01 DIAGNOSIS — Z1501 Genetic susceptibility to malignant neoplasm of breast: Secondary | ICD-10-CM | POA: Diagnosis not present

## 2022-05-01 DIAGNOSIS — Z9189 Other specified personal risk factors, not elsewhere classified: Secondary | ICD-10-CM | POA: Diagnosis not present

## 2022-05-01 DIAGNOSIS — Z0489 Encounter for examination and observation for other specified reasons: Secondary | ICD-10-CM | POA: Diagnosis not present

## 2022-05-01 DIAGNOSIS — R92323 Mammographic fibroglandular density, bilateral breasts: Secondary | ICD-10-CM | POA: Diagnosis not present

## 2022-05-01 DIAGNOSIS — R9230 Dense breasts, unspecified: Secondary | ICD-10-CM | POA: Diagnosis not present

## 2022-05-01 DIAGNOSIS — Z1239 Encounter for other screening for malignant neoplasm of breast: Secondary | ICD-10-CM | POA: Diagnosis not present

## 2022-05-01 DIAGNOSIS — Z803 Family history of malignant neoplasm of breast: Secondary | ICD-10-CM | POA: Diagnosis not present

## 2022-05-01 LAB — HM MAMMOGRAPHY

## 2022-05-02 DIAGNOSIS — R399 Unspecified symptoms and signs involving the genitourinary system: Secondary | ICD-10-CM | POA: Diagnosis not present

## 2022-05-05 DIAGNOSIS — Z1501 Genetic susceptibility to malignant neoplasm of breast: Secondary | ICD-10-CM | POA: Diagnosis not present

## 2022-05-05 DIAGNOSIS — Z1509 Genetic susceptibility to other malignant neoplasm: Secondary | ICD-10-CM | POA: Diagnosis not present

## 2022-05-05 DIAGNOSIS — N6481 Ptosis of breast: Secondary | ICD-10-CM | POA: Diagnosis not present

## 2022-05-05 DIAGNOSIS — N6489 Other specified disorders of breast: Secondary | ICD-10-CM | POA: Diagnosis not present

## 2022-05-19 DIAGNOSIS — Z1379 Encounter for other screening for genetic and chromosomal anomalies: Secondary | ICD-10-CM | POA: Diagnosis not present

## 2022-05-30 DIAGNOSIS — Z1379 Encounter for other screening for genetic and chromosomal anomalies: Secondary | ICD-10-CM | POA: Diagnosis not present

## 2022-05-30 DIAGNOSIS — Z1501 Genetic susceptibility to malignant neoplasm of breast: Secondary | ICD-10-CM | POA: Diagnosis not present

## 2022-05-30 DIAGNOSIS — Z1509 Genetic susceptibility to other malignant neoplasm: Secondary | ICD-10-CM | POA: Diagnosis not present

## 2022-06-16 DIAGNOSIS — Z1509 Genetic susceptibility to other malignant neoplasm: Secondary | ICD-10-CM | POA: Diagnosis not present

## 2022-06-16 DIAGNOSIS — Z1501 Genetic susceptibility to malignant neoplasm of breast: Secondary | ICD-10-CM | POA: Diagnosis not present

## 2022-06-16 DIAGNOSIS — Z1379 Encounter for other screening for genetic and chromosomal anomalies: Secondary | ICD-10-CM | POA: Diagnosis not present

## 2022-07-07 DIAGNOSIS — N281 Cyst of kidney, acquired: Secondary | ICD-10-CM | POA: Diagnosis not present

## 2022-07-07 DIAGNOSIS — K209 Esophagitis, unspecified without bleeding: Secondary | ICD-10-CM | POA: Diagnosis not present

## 2022-07-07 DIAGNOSIS — Z1289 Encounter for screening for malignant neoplasm of other sites: Secondary | ICD-10-CM | POA: Diagnosis not present

## 2022-07-07 DIAGNOSIS — K8689 Other specified diseases of pancreas: Secondary | ICD-10-CM | POA: Diagnosis not present

## 2022-07-07 DIAGNOSIS — N2889 Other specified disorders of kidney and ureter: Secondary | ICD-10-CM | POA: Diagnosis not present

## 2022-07-07 DIAGNOSIS — Z1501 Genetic susceptibility to malignant neoplasm of breast: Secondary | ICD-10-CM | POA: Diagnosis not present

## 2022-07-07 DIAGNOSIS — Z1509 Genetic susceptibility to other malignant neoplasm: Secondary | ICD-10-CM | POA: Diagnosis not present

## 2022-07-07 DIAGNOSIS — Z79899 Other long term (current) drug therapy: Secondary | ICD-10-CM | POA: Diagnosis not present

## 2022-07-10 DIAGNOSIS — Z1509 Genetic susceptibility to other malignant neoplasm: Secondary | ICD-10-CM | POA: Diagnosis not present

## 2022-07-10 DIAGNOSIS — Z1501 Genetic susceptibility to malignant neoplasm of breast: Secondary | ICD-10-CM | POA: Diagnosis not present

## 2022-07-10 DIAGNOSIS — Z1379 Encounter for other screening for genetic and chromosomal anomalies: Secondary | ICD-10-CM | POA: Diagnosis not present

## 2022-07-11 ENCOUNTER — Ambulatory Visit: Payer: Federal, State, Local not specified - PPO | Admitting: Family Medicine

## 2022-07-11 ENCOUNTER — Encounter: Payer: Self-pay | Admitting: Family Medicine

## 2022-07-11 VITALS — BP 110/62 | HR 73 | Temp 98.9°F | Ht 66.0 in | Wt 159.8 lb

## 2022-07-11 DIAGNOSIS — N2889 Other specified disorders of kidney and ureter: Secondary | ICD-10-CM

## 2022-07-11 DIAGNOSIS — N281 Cyst of kidney, acquired: Secondary | ICD-10-CM

## 2022-07-11 DIAGNOSIS — Z1501 Genetic susceptibility to malignant neoplasm of breast: Secondary | ICD-10-CM | POA: Diagnosis not present

## 2022-07-11 DIAGNOSIS — E8940 Asymptomatic postprocedural ovarian failure: Secondary | ICD-10-CM | POA: Diagnosis not present

## 2022-07-11 DIAGNOSIS — F5101 Primary insomnia: Secondary | ICD-10-CM

## 2022-07-11 DIAGNOSIS — Z1509 Genetic susceptibility to other malignant neoplasm: Secondary | ICD-10-CM

## 2022-07-11 DIAGNOSIS — F419 Anxiety disorder, unspecified: Secondary | ICD-10-CM

## 2022-07-11 NOTE — Assessment & Plan Note (Signed)
Rarely takes a xanax 0.5 mg for emergencies

## 2022-07-11 NOTE — Assessment & Plan Note (Signed)
Reviewed some noted from [facility] (oncol and surg) Has had prelim breast surgery in prep for nipple sparing mastectomy Also total hysterectomy   Her father has stage 4 prostate cancer   She will need bimanual pelvic exam and discussion re: hormones and bone density yearly

## 2022-07-11 NOTE — Assessment & Plan Note (Signed)
Uses about 20 ambien per year for occational trouble sleeping Discussed pros/cons/risks of this drug  Understands to take med when already in bed and monitor for any abn behavior in the night  Understands this can be habit forming every day   Will call when she needs refill next   Has used xanax infrequently in past as well - would rather avoid this medication unless absolutely necessary

## 2022-07-11 NOTE — Progress Notes (Signed)
Subjective:    Patient ID: Elizabeth Sheppard, female    DOB: 05-28-1970, 52 y.o.   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  Father has prostate cancer   Bilateral mastopexy - planning for mastectomy / had a hard time healing  Hysterectomy / total  Also bladder sling   Egd on Friday to look at pancreas (pancreas looked great but had 6 cm cyst on kidney)  Fluid filled/ likely simple    Anechoic lesion suggestive of a cyst was id in middle region of left kidney measuring 63 mm by 37 mm  Single compartment A seprate renal cyst 9.3 by 6.6  mm also noted in that kidney   Needs to have CT scan  Did not mention with or without contrast     Will need a bimanual pelvic exam - yearly  Last one was in December   Gets 20 ambien and 20 xanax for sleep  She will no longer see her gyn   Tried trazodone and it did not work       Biomedical engineer  Component Value Date   NA 139 04/28/2020   K 4.7 04/28/2020   CO2 28 04/28/2020   GLUCOSE 63 (L) 04/28/2020   BUN 15 04/28/2020   CREATININE 0.70 04/28/2020   CALCIUM 10.1 04/28/2020   GFR 101.28 04/28/2020   GFRNONAA 120 01/22/2008       Patient Active Problem List   Diagnosis Date Noted   Renal cyst 07/11/2022   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   Routine general medical examination at a health care facility 04/28/2020   Colon cancer screening 04/28/2020   Anxiety 01/21/2013   Insomnia 12/19/2011   Past Medical History:  Diagnosis Date   BRCA2 positive    Dysplastic nevus 07/09/2017   left upper flank   Past Surgical History:  Procedure Laterality Date   COLONOSCOPY  WITH PROPOFOL N/A 07/26/2020   Procedure: COLONOSCOPY WITH PROPOFOL;  Surgeon: Wyline Mood, MD;  Location: [facility] ENDOSCOPY;  Service: Gastroenterology;  Laterality: N/A;   DIAGNOSTIC LAPAROSCOPY     LAPAROSCOPIC ENDOMETRIOSIS FULGURATION     TUBAL LIGATION     Social History   Tobacco Use   Smoking status: Never   Smokeless tobacco: Never  Vaping Use   Vaping Use: Never used  Substance Use Topics   Alcohol use: Yes    Comment: occasional wine   Drug use: No   Family History  Problem Relation Age of Onset   Cancer Maternal Grandmother        skin cancer on the vagina   Cancer Paternal Grandfather        prostate   No Known Allergies Current Outpatient Medications on File Prior to Visit  Medication Sig Dispense Refill   estradiol (VIVELLE-DOT) 0.05 MG/24HR patch APPLY 1 PATCH TWICE A WEEK     zolpidem (AMBIEN CR) 12.5 MG CR tablet TAKE 1 TABLET BY MOUTH AT BEDTIME AS NEEDED FOR SLEEP 30 tablet 1   alclomethasone (ACLOVATE) 0.05 % ointment Apply  topically as directed. Qd to bid aa lips prn flares (Patient not taking: Reported on 05/10/2020) 30 g 1   No current facility-administered medications on file prior to visit.    Review of Systems  Constitutional:  Negative for activity change, appetite change, fatigue, fever and unexpected weight change.  HENT:  Negative for congestion, ear pain, rhinorrhea, sinus pressure and sore throat.   Eyes:  Negative for pain, redness and visual disturbance.  Respiratory:  Negative for cough, shortness of breath and wheezing.   Cardiovascular:  Negative for chest pain and palpitations.  Gastrointestinal:  Negative for abdominal pain, blood in stool, constipation and diarrhea.  Endocrine: Negative for polydipsia and polyuria.  Genitourinary:  Negative for dysuria, frequency and urgency.  Musculoskeletal:  Negative for arthralgias, back pain and myalgias.  Skin:  Negative for pallor and rash.       Healing scars from breast surgery   Allergic/Immunologic: Negative for environmental allergies.  Neurological:  Negative for dizziness, syncope and headaches.  Hematological:  Negative for adenopathy. Does not bruise/bleed easily.  Psychiatric/Behavioral:  Negative for decreased concentration and dysphoric mood. The patient is not nervous/anxious.        Objective:   Physical Exam Constitutional:      General: She is not in acute distress.    Appearance: Normal appearance. She is well-developed and normal weight. She is not ill-appearing or diaphoretic.  HENT:     Head: Normocephalic and atraumatic.  Eyes:     Conjunctiva/sclera: Conjunctivae normal.     Pupils: Pupils are equal, round, and reactive to light.  Neck:     Thyroid: No thyromegaly.     Vascular: No carotid bruit or JVD.  Cardiovascular:     Rate and Rhythm: Normal rate and regular rhythm.     Heart sounds: Normal heart sounds.     No gallop.  Pulmonary:     Effort: Pulmonary effort is normal. No respiratory distress.     Breath sounds: Normal breath sounds. No stridor. No wheezing, rhonchi or rales.  Abdominal:     General: There is no distension or abdominal bruit.     Palpations: Abdomen is soft. There is no mass.     Tenderness: There is no abdominal tenderness. There is no right CVA tenderness, left CVA tenderness, guarding or rebound.     Hernia: No hernia is present.  Musculoskeletal:     Cervical back: Normal range of motion and neck supple.     Right lower leg: No edema.     Left lower leg: No edema.  Lymphadenopathy:     Cervical: No cervical adenopathy.  Skin:    General: Skin is warm and dry.     Coloration: Skin is not pale.     Findings: No rash.     Comments: Healing scars from breast surgery   Neurological:     Mental Status: She is alert.     Coordination: Coordination normal.     Deep Tendon Reflexes: Reflexes are normal and symmetric. Reflexes normal.  Psychiatric:        Mood and Affect: Mood normal.            Assessment & Plan:   Problem List Items Addressed This Visit       Genitourinary   Renal cyst - Primary    Large anechoic lesion consistent with cyst in middle of left kidney 63 by 37 mm This was incidental- no symptoms  Needs CT for verification (prefers in [place])  Lab today  Will order for [place] once labs return  Pt seen at [facility] for [facility] 2 gene mutation-has had prelim breast surgery then mastectomy Also total hysterectomy  Taking estrogen but not progesterone due to breast cancer risk      Relevant Orders   Comprehensive metabolic panel   CBC with Differential/Platelet     Other   Surgical menopause    Reviewed records from [facility] in care everywhere Has had total hysterectomy for the [facility] 2 gene   On est 0.05 mg patch No progesterone due to breast ca risk   Will return here yearly for bimanual exam        Insomnia    Uses about 20 ambien per year for occational trouble sleeping Discussed pros/cons/risks of this drug  Understands to take med when already in bed and monitor for any abn behavior in the night  Understands this can be habit forming every day   Will call when she needs refill next   Has used xanax infrequently in past as well - would rather avoid this medication unless absolutely necessary       BRCA gene mutation positive    Reviewed some noted from [facility] (oncol and surg) Has had prelim breast surgery in prep for nipple sparing mastectomy Also total hysterectomy   Her father has stage 4 prostate cancer   She will need bimanual pelvic exam and discussion re: hormones and bone density yearly       Anxiety    Rarely takes a xanax 0.5 mg for emergencies

## 2022-07-11 NOTE — Patient Instructions (Signed)
Labs today    I will order a CT scan for the renal cyst when we get your results    Let us know when you need ambien for sleep   I would rather avoid xanax unless an emergency occurs

## 2022-07-11 NOTE — Assessment & Plan Note (Signed)
Reviewed records from [facility] in care everywhere Has had total hysterectomy for the [facility] 2 gene   On est 0.05 mg patch No progesterone due to breast ca risk   Will return here yearly for bimanual exam

## 2022-07-11 NOTE — Assessment & Plan Note (Addendum)
Large anechoic lesion consistent with cyst in middle of left kidney 63 by 37 mm This was incidental- no symptoms  Needs CT for verification (prefers in [place])  Lab today  Will order for [place] once labs return  Pt seen at [facility] for [place] 2 gene mutation-has had prelim breast surgery then mastectomy Also total hysterectomy  Taking estrogen but not progesterone due to breast cancer risk

## 2022-07-12 LAB — COMPREHENSIVE METABOLIC PANEL
ALT: 16 U/L (ref 0–35)
AST: 18 U/L (ref 0–37)
Albumin: 4.6 g/dL (ref 3.5–5.2)
Alkaline Phosphatase: 65 U/L (ref 39–117)
BUN: 22 mg/dL (ref 6–23)
CO2: 30 mEq/L (ref 19–32)
Calcium: 10.2 mg/dL (ref 8.4–10.5)
Chloride: 102 mEq/L (ref 96–112)
Creatinine, Ser: 0.7 mg/dL (ref 0.40–1.20)
GFR: 99.73 mL/min (ref >60.00)
Glucose, Bld: 84 mg/dL (ref 70–99)
Potassium: 4.3 mEq/L (ref 3.5–5.1)
Sodium: 139 mEq/L (ref 135–145)
Total Bilirubin: 0.3 mg/dL (ref 0.2–1.2)
Total Protein: 7.8 g/dL (ref 6.0–8.3)

## 2022-07-12 LAB — CBC WITH DIFFERENTIAL/PLATELET
Basophils Absolute: 0.1 10*3/uL (ref 0.0–0.1)
Basophils Relative: 1.1 % (ref 0.0–3.0)
Eosinophils Absolute: 0.2 10*3/uL (ref 0.0–0.7)
Eosinophils Relative: 3.1 % (ref 0.0–5.0)
HCT: 42.5 % (ref 36.0–46.0)
Hemoglobin: 14.1 g/dL (ref 12.0–15.0)
Lymphocytes Relative: 29.3 % (ref 12.0–46.0)
Lymphs Abs: 1.8 10*3/uL (ref 0.7–4.0)
MCHC: 33.1 g/dL (ref 30.0–36.0)
MCV: 96.1 fl (ref 78.0–100.0)
Monocytes Absolute: 0.5 10*3/uL (ref 0.1–1.0)
Monocytes Relative: 7.5 % (ref 3.0–12.0)
Neutro Abs: 3.6 10*3/uL (ref 1.4–7.7)
Neutrophils Relative %: 59 % (ref 43.0–77.0)
Platelets: 319 10*3/uL (ref 150.0–400.0)
RBC: 4.42 Mil/uL (ref 3.87–5.11)
RDW: 12.7 % (ref 11.5–15.5)
WBC: 6 10*3/uL (ref 4.0–10.5)

## 2022-07-12 NOTE — Addendum Note (Signed)
Addended by: Roxy Manns A on: 07/12/2022 09:37 PM   Modules accepted: Orders

## 2022-07-21 ENCOUNTER — Ambulatory Visit
Admission: RE | Admit: 2022-07-21 | Discharge: 2022-07-21 | Disposition: A | Discharge status: Home / Self Care | Payer: Federal, State, Local not specified - PPO | Source: Ambulatory Visit | Attending: Family Medicine | Admitting: Family Medicine

## 2022-07-21 DIAGNOSIS — N2889 Other specified disorders of kidney and ureter: Secondary | ICD-10-CM

## 2022-07-21 DIAGNOSIS — N200 Calculus of kidney: Secondary | ICD-10-CM | POA: Diagnosis not present

## 2022-07-21 DIAGNOSIS — N281 Cyst of kidney, acquired: Secondary | ICD-10-CM

## 2022-07-21 MED ORDER — IOPAMIDOL (ISOVUE-300) INJECTION 61%
125.0000 mL | Freq: Once | INTRAVENOUS | Status: AC | PRN
  Administered 2022-07-21: 125 mL via INTRAVENOUS

## 2022-08-01 ENCOUNTER — Other Ambulatory Visit: Payer: Self-pay | Admitting: Family Medicine

## 2022-08-01 ENCOUNTER — Encounter: Payer: Self-pay | Admitting: Family Medicine

## 2022-08-01 DIAGNOSIS — N2889 Other specified disorders of kidney and ureter: Secondary | ICD-10-CM

## 2022-08-01 DIAGNOSIS — E8940 Asymptomatic postprocedural ovarian failure: Secondary | ICD-10-CM

## 2022-08-01 DIAGNOSIS — Z1501 Genetic susceptibility to malignant neoplasm of breast: Secondary | ICD-10-CM

## 2022-08-01 DIAGNOSIS — F5101 Primary insomnia: Secondary | ICD-10-CM

## 2022-08-01 DIAGNOSIS — F419 Anxiety disorder, unspecified: Secondary | ICD-10-CM

## 2022-08-01 DIAGNOSIS — N281 Cyst of kidney, acquired: Secondary | ICD-10-CM

## 2022-08-02 NOTE — Progress Notes (Signed)
She already has a urologist at [facility] picked out with appt in oct We will send over the CT report

## 2022-08-02 NOTE — Telephone Encounter (Signed)
Please send copy of her CT to that urologist  Thanks

## 2022-08-03 NOTE — Telephone Encounter (Signed)
sent 

## 2022-08-24 ENCOUNTER — Encounter: Payer: Self-pay | Admitting: Dermatology

## 2022-08-24 ENCOUNTER — Ambulatory Visit: Payer: Federal, State, Local not specified - PPO | Admitting: Dermatology

## 2022-08-24 VITALS — BP 138/77 | HR 67

## 2022-08-24 DIAGNOSIS — D492 Neoplasm of unspecified behavior of bone, soft tissue, and skin: Secondary | ICD-10-CM

## 2022-08-24 DIAGNOSIS — L821 Other seborrheic keratosis: Secondary | ICD-10-CM

## 2022-08-24 DIAGNOSIS — L988 Other specified disorders of the skin and subcutaneous tissue: Secondary | ICD-10-CM | POA: Diagnosis not present

## 2022-08-24 DIAGNOSIS — D2262 Melanocytic nevi of left upper limb, including shoulder: Secondary | ICD-10-CM

## 2022-08-24 DIAGNOSIS — D225 Melanocytic nevi of trunk: Secondary | ICD-10-CM | POA: Diagnosis not present

## 2022-08-24 NOTE — Progress Notes (Signed)
Follow-Up Visit   Subjective  Elizabeth Sheppard is a 52 y.o. female who presents for the following: Mole that is changing. Noticed changes 6-8 weeks ago. Left upper arm, posterior.  Mole at pubic area. Concerned could be changing.  Lesion on forehead has been removed in the past, has grown back. Would like removed again. Patient picks at. Area bleeds when picked.  BRCA II + , states has higher chance of developing MM.   The patient has spots, moles and lesions to be evaluated, some may be new or changing and the patient may have concern these could be cancer.    The following portions of the chart were reviewed this encounter and updated as appropriate: medications, allergies, medical history  Review of Systems:  No other skin or systemic complaints except as noted in HPI or Assessment and Plan.  Objective  Well appearing patient in no apparent distress; mood and affect are within normal limits.  A focused examination was performed of the following areas: Left arm, suprapubic, face  Relevant physical exam findings are noted in the Assessment and Plan.  Left glabella 3 mm pink slightly scaly papule 2 mm subQ nodule underneath after initial shave       Left Upper Arm - Posterior Dark gray papule on left upper arm    Assessment & Plan   Neoplasm of skin Left glabella  Epidermal / dermal shaving  Lesion diameter (cm):  0.3 Informed consent: discussed and consent obtained   Patient was prepped and draped in usual sterile fashion: Area prepped with alcohol. Anesthesia: the lesion was anesthetized in a standard fashion   Anesthetic:  1% lidocaine w/ epinephrine 1-100,000 buffered w/ 8.4% NaHCO3 Instrument used: DermaBlade   Instrument used comment:  2 mm punch Hemostasis achieved with: pressure, aluminum chloride and electrodesiccation   Outcome: patient tolerated procedure well   Post-procedure details: wound care instructions given   Post-procedure details  comment:  Ointment and small bandage applied  Specimen 1 - Surgical pathology Differential Diagnosis: R/O recurrent hemangioma vs prurigo nodule vs hypertrophic scar  Check Margins: No Previous pathology: [id number] 3 pieces of specimen in bottle  Lesion previously biopsied on 09/06/20 and was an ulcerated angioma. New lesion formed in its place that was cauterized 03/21/22. Patient was told that recurrence was possible given a visible component at the base. Lesion recurred and patient picks at it. 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 Performed superficial shave biopsy and a residual component was visible at the base. Informed patient that the superficial biopsy would heal the best, but lesion may recur given the pink 2 mm nodule in the base. Patient stated that the bothersome and symptomatic nature of the lesion warranted removal and more comprehensive removal was favoured over a shallow biopsy even if the resulting scar is worse. Performed second deeper shave biopsy, but lesion was still present in the base. Discussed this with patient who wanted further excision to remove lesion at the base. Performed 2 mm punch to remove nodule in the base. Cautery, AlCl, and pressure were used for hemostasis. Wound is small enough to heal well by second intention, but emphasized that patient must consistently keep it covered with vaseline to maximize healing and minimize scarring. Patient understood and all questions were answered.  Seborrheic keratosis Left Upper Arm - Posterior  Suspect seborrheic keratosis vs less likely blue nevus or venous [place] No change on diascopy Appears benign Continue to monitor   MELANOCYTIC NEVI Exam: Tan-brown and/or pink-flesh-colored  symmetric macules and papules  Right suprapubic area  Left arm above elbow  Treatment Plan: Benign appearing on exam today. Recommend observation. Call clinic for new or changing moles. Recommend daily use of broad spectrum spf 30+  sunscreen to sun-exposed areas.    Return for Follow Up As Scheduled.  I, Lawson Radar, CMA, am acting as scribe for Elie Goody, MD.   Documentation: I have reviewed the above documentation for accuracy and completeness, and I agree with the above.  Elie Goody, MD

## 2022-08-24 NOTE — Patient Instructions (Signed)
Wound Care Instructions  Cleanse wound gently with soap and water once a day then pat dry with clean gauze. Apply a thin coat of Petrolatum (petroleum jelly, "Vaseline") over the wound (unless you have an allergy to this). We recommend that you use a new, sterile tube of Vaseline. Do not pick or remove scabs. Do not remove the yellow or white "healing tissue" from the base of the wound.  Cover the wound with fresh, clean, nonstick gauze and secure with paper tape. You may use Band-Aids in place of gauze and tape if the wound is small enough, but would recommend trimming much of the tape off as there is often too much. Sometimes Band-Aids can irritate the skin.  You should call the office for your biopsy report after 1 week if you have not already been contacted.  If you experience any problems, such as abnormal amounts of bleeding, swelling, significant bruising, significant pain, or evidence of infection, please call the office immediately.  FOR ADULT SURGERY PATIENTS: If you need something for pain relief you may take 1 extra strength Tylenol (acetaminophen) AND 2 Ibuprofen (200mg  each) together every 4 hours as needed for pain. (do not take these if you are allergic to them or if you have a reason you should not take them.) Typically, you may only need pain medication for 1 to 3 days.   Due to recent changes in healthcare laws, you may see results of your pathology and/or laboratory studies on MyChart before the doctors have had a chance to review them. We understand that in some cases there may be results that are confusing or concerning to you. Please understand that not all results are received at the same time and often the doctors may need to interpret multiple results in order to provide you with the best plan of care or course of treatment. Therefore, we ask that you please give [place] 2 business days to thoroughly review all your results before contacting the office for clarification. Should we  see a critical lab result, you will be contacted sooner.   If You Need Anything After Your Visit  If you have any questions or concerns for your doctor, please call our main line at [phone] and press option 4 to reach your doctor's medical assistant. If no one answers, please leave a voicemail as directed and we will return your call as soon as possible. Messages left after 4 pm will be answered the following business day.   You may also send [place] a message via MyChart. We typically respond to MyChart messages within 1-2 business days.  For prescription refills, please ask your pharmacy to contact our office. Our fax number is [phone].  If you have an urgent issue when the clinic is closed that cannot wait until the next business day, you can page your doctor at the number below.    Please note that while we do our best to be available for urgent issues outside of office hours, we are not available 24/7.   If you have an urgent issue and are unable to reach [place], you may choose to seek medical care at your doctor's office, retail clinic, urgent care center, or emergency room.  If you have a medical emergency, please immediately call 911 or go to the emergency department.  Pager Numbers  - Dr. Gwen Pounds: [phone]  - Dr. Roseanne Reno: [phone]  In the event of inclement weather, please call our main line at [phone] for an update on the status  of any delays or closures.  Dermatology Medication Tips: Please keep the boxes that topical medications come in in order to help keep track of the instructions about where and how to use these. Pharmacies typically print the medication instructions only on the boxes and not directly on the medication tubes.   If your medication is too expensive, please contact our office at [phone] option 4 or send [place] a message through MyChart.   We are unable to tell what your co-pay for medications will be in advance as this is different  depending on your insurance coverage. However, we may be able to find a substitute medication at lower cost or fill out paperwork to get insurance to cover a needed medication.   If a prior authorization is required to get your medication covered by your insurance company, please allow [place] 1-2 business days to complete this process.  Drug prices often vary depending on where the prescription is filled and some pharmacies may offer cheaper prices.  The website www.goodrx.com contains coupons for medications through different pharmacies. The prices here do not account for what the cost may be with help from insurance (it may be cheaper with your insurance), but the website can give you the price if you did not use any insurance.  - You can print the associated coupon and take it with your prescription to the pharmacy.  - You may also stop by our office during regular business hours and pick up a GoodRx coupon card.  - If you need your prescription sent electronically to a different pharmacy, notify our office through [facility] or by phone at [phone] option 4.     Si Usted Necesita Algo Despus de Su Visita  Tambin puede enviarnos un mensaje a travs de Clinical cytogeneticist. Por lo general respondemos a los mensajes de MyChart en el transcurso de 1 a 2 das hbiles.  Para renovar recetas, por favor pida a su farmacia que se ponga en contacto con nuestra oficina. Annie Sable de fax es [place] [phone].  Si tiene un asunto urgente cuando la clnica est cerrada y que no puede esperar hasta el siguiente da hbil, puede llamar/localizar a su doctor(a) al nmero que aparece a continuacin.   Por favor, tenga en cuenta que aunque hacemos todo lo posible para estar disponibles para asuntos urgentes fuera del horario de [place], no estamos disponibles las 24 horas del da, los 7 [address]  [address] de la [place].   Si tiene un problema urgente y no puede comunicarse con nosotros, puede optar por buscar atencin  mdica  en el consultorio de su doctor(a), en una clnica privada, en un centro de atencin urgente o en una sala de emergencias.  Si tiene Engineer, drilling, por favor llame inmediatamente al 911 o vaya a la sala de emergencias.  Nmeros de bper  - Dr. Gwen Pounds: [phone]  - Dra. Roseanne Reno: [phone]  En caso de inclemencias del [place], por favor llame a Lacy Duverney principal al [phone] para una actualizacin sobre el [place] de cualquier retraso o cierre.  Consejos para la medicacin en dermatologa: Por favor, guarde las cajas en las que vienen los medicamentos de uso tpico para ayudarle a seguir las instrucciones sobre dnde y cmo usarlos. Las farmacias generalmente imprimen las instrucciones del medicamento slo en las cajas y no directamente en los tubos del [place].   Si su medicamento es muy caro, por favor, pngase en contacto con Rolm Gala llamando al [phone] y presione la opcin 4 o envenos  un mensaje a travs de MyChart.   No podemos decirle cul ser su copago por los medicamentos por adelantado ya que esto es diferente dependiendo de la cobertura de su seguro. Sin embargo, es posible que podamos encontrar un medicamento sustituto a Audiological scientist un formulario para que el seguro cubra el medicamento que se considera necesario.   Si se requiere una autorizacin previa para que su compaa de seguros [place] su medicamento, por favor permtanos de 1 a 2 das hbiles para completar [address]  Los precios de los medicamentos varan con frecuencia dependiendo del Environmental consultant de dnde se surte la receta y alguna farmacias pueden ofrecer precios ms baratos.  El sitio web www.goodrx.com tiene cupones para medicamentos de Health and safety inspector. Los precios aqu no tienen en cuenta lo que podra costar con la ayuda del seguro (puede ser ms barato con su seguro), pero el sitio web puede darle el precio si no utiliz Tourist information centre manager.  - Puede imprimir el  cupn correspondiente y llevarlo con su receta a la farmacia.  - Tambin puede pasar por nuestra oficina durante el horario de atencin regular y Education officer, museum una tarjeta de cupones de GoodRx.  - Si necesita que su receta se enve electrnicamente a una farmacia diferente, informe a nuestra oficina a travs de MyChart de [place] o por telfono llamando al [phone] y presione la opcin 4.

## 2022-08-24 NOTE — Progress Notes (Deleted)
   Follow-Up Visit   Subjective  Elizabeth Sheppard is a 52 y.o. female who presents for the following: Mole that is changing. Noticed changes 6-8 weeks ago. Left upper arm, posterior.  Mole at pubic area. Would like removed.  Lesion on forehead has been removed in the past, has grown back. Would like removed again. Patient picks at.  BRCA II + ,   The patient has spots, moles and lesions to be evaluated, some may be new or changing and the patient may have concern these could be cancer.    The following portions of the chart were reviewed this encounter and updated as appropriate: medications, allergies, medical history  Review of Systems:  No other skin or systemic complaints except as noted in HPI or Assessment and Plan.  Objective  Well appearing patient in no apparent distress; mood and affect are within normal limits.  A focused examination was performed of the following areas: *** Relevant physical exam findings are noted in the Assessment and Plan.    Assessment & Plan     Follow-Up Visit   Subjective  Elizabeth Sheppard is a 52 y.o. female who presents for the following: Skin Cancer Screening and Full Body Skin Exam  The patient presents for Total-Body Skin Exam (TBSE) for skin cancer screening and mole check. The patient has spots, moles and lesions to be evaluated, some may be new or changing and the patient may have concern these could be cancer.    The following portions of the chart were reviewed this encounter and updated as appropriate: medications, allergies, medical history  Review of Systems:  No other skin or systemic complaints except as noted in HPI or Assessment and Plan.  Objective  Well appearing patient in no apparent distress; mood and affect are within normal limits.  A full examination was performed including scalp, head, eyes, ears, nose, lips, neck, chest, axillae, abdomen, back, buttocks, bilateral upper extremities, bilateral lower  extremities, hands, feet, fingers, toes, fingernails, and toenails. All findings within normal limits unless otherwise noted below.   Relevant physical exam findings are noted in the Assessment and Plan.    Assessment & Plan    MELANOCYTIC NEVI Left upper arm above elbow: 4.0 mm tan papule with gray blue macule at edge   L post upper arm 2.0 mm gray blue papule Benign-appearing. Stable compared to previous visit. Observation.  Call clinic for new or changing moles.  Recommend daily use of broad spectrum spf 30+ sunscreen to sun-exposed areas.   - Benign appearing on exam today - Observation - Call clinic for new or changing moles - Recommend daily use of broad spectrum spf 30+ sunscreen to sun-exposed areas.       No follow-ups on file.  I, Lawson Radar, CMA, am acting as scribe for Elie Goody, MD.   Documentation: I have reviewed the above documentation for accuracy and completeness, and I agree with the above.  Elie Goody, MD      No follow-ups on file.  ***  Documentation: I have reviewed the above documentation for accuracy and completeness, and I agree with the above.  Elie Goody, MD

## 2022-09-04 ENCOUNTER — Telehealth: Payer: Self-pay

## 2022-09-04 NOTE — Telephone Encounter (Signed)
Called patient. LMOVM to C/B if any questions regarding biopsy results as she has seen them on her MyChart.

## 2022-09-04 NOTE — Telephone Encounter (Signed)
-----   Message from [place] sent at 09/01/2022  4:35 PM EDT ----- Diagnosis: Skin , left glabella CUTANEOUS LYMPHOID HYPERPLASIA  Please call to get update on healing. Please convey: The spot we removed from your forehead shows a benign accumulation of immune cells that appears to be reactive to the prior biopsy and to any manipulation of the area since then. This is not worrisome or harmful. Please monitor the area as it heals. If any bumps form, it bleeds, it's painful, or it won't heal after a month, then please contact us for an appointment.  Plan: follow up 03/27/23 with Dr Roseanne Reno

## 2022-09-13 DIAGNOSIS — N281 Cyst of kidney, acquired: Secondary | ICD-10-CM | POA: Diagnosis not present

## 2022-10-30 DIAGNOSIS — Z1501 Genetic susceptibility to malignant neoplasm of breast: Secondary | ICD-10-CM | POA: Diagnosis not present

## 2022-10-30 DIAGNOSIS — Z1509 Genetic susceptibility to other malignant neoplasm: Secondary | ICD-10-CM | POA: Diagnosis not present

## 2022-10-30 DIAGNOSIS — Z09 Encounter for follow-up examination after completed treatment for conditions other than malignant neoplasm: Secondary | ICD-10-CM | POA: Diagnosis not present

## 2022-10-30 DIAGNOSIS — Z9889 Other specified postprocedural states: Secondary | ICD-10-CM | POA: Diagnosis not present

## 2022-11-16 DIAGNOSIS — Z1509 Genetic susceptibility to other malignant neoplasm: Secondary | ICD-10-CM | POA: Diagnosis not present

## 2022-11-16 DIAGNOSIS — Z1239 Encounter for other screening for malignant neoplasm of breast: Secondary | ICD-10-CM | POA: Diagnosis not present

## 2022-11-16 DIAGNOSIS — Z1501 Genetic susceptibility to malignant neoplasm of breast: Secondary | ICD-10-CM | POA: Diagnosis not present

## 2022-11-19 ENCOUNTER — Telehealth: Payer: Self-pay | Admitting: Family Medicine

## 2022-11-19 DIAGNOSIS — Z0000 Encounter for general adult medical examination without abnormal findings: Secondary | ICD-10-CM

## 2022-11-19 NOTE — Telephone Encounter (Signed)
-----   Message from Vincenza Hews sent at 11/08/2022  1:53 PM EDT ----- Regarding: Lab Mon. 11/20/22 Hello,  Patient is coming in for CPE labs on Monday 11/20/22. Can we get orders please.   Thanks

## 2022-11-20 ENCOUNTER — Other Ambulatory Visit (INDEPENDENT_AMBULATORY_CARE_PROVIDER_SITE_OTHER): Payer: Federal, State, Local not specified - PPO

## 2022-11-20 DIAGNOSIS — Z0000 Encounter for general adult medical examination without abnormal findings: Secondary | ICD-10-CM

## 2022-11-20 LAB — COMPREHENSIVE METABOLIC PANEL
ALT: 14 U/L (ref 0–35)
AST: 15 U/L (ref 0–37)
Albumin: 4.4 g/dL (ref 3.5–5.2)
Alkaline Phosphatase: 66 U/L (ref 39–117)
BUN: 21 mg/dL (ref 6–23)
CO2: 30 meq/L (ref 19–32)
Calcium: 9.3 mg/dL (ref 8.4–10.5)
Chloride: 104 meq/L (ref 96–112)
Creatinine, Ser: 0.8 mg/dL (ref 0.40–1.20)
GFR: 84.75 mL/min (ref >60.00)
Glucose, Bld: 81 mg/dL (ref 70–99)
Potassium: 4.4 meq/L (ref 3.5–5.1)
Sodium: 140 meq/L (ref 135–145)
Total Bilirubin: 0.2 mg/dL (ref 0.2–1.2)
Total Protein: 7.1 g/dL (ref 6.0–8.3)

## 2022-11-20 LAB — CBC WITH DIFFERENTIAL/PLATELET
Basophils Absolute: 0 10*3/uL (ref 0.0–0.1)
Basophils Relative: 0.8 % (ref 0.0–3.0)
Eosinophils Absolute: 0.1 10*3/uL (ref 0.0–0.7)
Eosinophils Relative: 1.7 % (ref 0.0–5.0)
HCT: 42.1 % (ref 36.0–46.0)
Hemoglobin: 13.6 g/dL (ref 12.0–15.0)
Lymphocytes Relative: 30.9 % (ref 12.0–46.0)
Lymphs Abs: 1.7 10*3/uL (ref 0.7–4.0)
MCHC: 32.3 g/dL (ref 30.0–36.0)
MCV: 96.2 fL (ref 78.0–100.0)
Monocytes Absolute: 0.4 10*3/uL (ref 0.1–1.0)
Monocytes Relative: 7.5 % (ref 3.0–12.0)
Neutro Abs: 3.3 10*3/uL (ref 1.4–7.7)
Neutrophils Relative %: 59.1 % (ref 43.0–77.0)
Platelets: 320 10*3/uL (ref 150.0–400.0)
RBC: 4.38 Mil/uL (ref 3.87–5.11)
RDW: 13.1 % (ref 11.5–15.5)
WBC: 5.6 10*3/uL (ref 4.0–10.5)

## 2022-11-20 LAB — LIPID PANEL
Cholesterol: 170 mg/dL (ref 0–200)
HDL: 61.9 mg/dL (ref >39.00)
LDL Cholesterol: 91 mg/dL (ref 0–99)
NonHDL: 108.45
Total CHOL/HDL Ratio: 3
Triglycerides: 87 mg/dL (ref 0.0–149.0)
VLDL: 17.4 mg/dL (ref 0.0–40.0)

## 2022-11-20 LAB — TSH: TSH: 2.74 u[IU]/mL (ref 0.35–5.50)

## 2022-11-27 ENCOUNTER — Encounter: Payer: Federal, State, Local not specified - PPO | Admitting: Family Medicine

## 2022-12-05 NOTE — Progress Notes (Unsigned)
Subjective:    Patient ID: Elizabeth Sheppard, female    DOB: 08/20/1970, 52 y.o.   MRN: [id number]  HPI  Here for health maintenance exam and to review chronic medical problems   Wt Readings from Last 3 Encounters:  12/06/22 165 lb (74.8 kg)  07/11/22 159 lb 12.8 oz (72.5 kg)  07/26/20 150 lb 5 oz (68.2 kg)   27.46 kg/m  Vitals:   12/06/22 1457  BP: 130/76  Pulse: 70  Temp: 98.1 F (36.7 C)  SpO2: 98%    Immunization History  Administered Date(s) Administered   H1N1 01/22/2008   Influenza Split 12/19/2011   Influenza, Seasonal, Injecte, Preservative Fre 12/06/2022   Influenza,inj,Quad PF,6+ Mos 01/21/2013, 10/12/2015, 09/26/2017   Moderna Sars-Covid-2 Vaccination 01/31/2019, 02/28/2019   Td 05/02/2006   Tdap 02/18/2014    There are no preventive care reminders to display for this patient.  Lost her father a week ago from stage 4 prostate cancer  Eats more when stress and funeral /visitors etc    Flu shot -will get today   Took original covid vaccine  Not the booster    HIV / hep C screening - low risk/declines   Shingrix - declines for now  Some fear of vaccine   M[facility] was 04/20[facility] normal  BRAC positive gene and planning mastectomy soon for that (postponed until after jan 1 due to loss of her father)   will have reconstruction  Had MRI at [facility] in October -reassuring  Self breast exam: no lumps Just had exam   Gyn health Had a hysterectomy last year  Sees gyn  On vivelle 0.05 mg for estrogen  Also premarin vaginal cream  History of BRCA gene mutation  Pap normal 2009   Had bladder sling also  [facility] gyn did prescription for estrogen cream (even with the patch)   Other than weight gain /not a lot of menopause symptoms    Colon cancer screening  due in 2029 for 7 y recall   Bone health  Dexa -interested in the future  Falls-none  Fractures-none  Supplements  ca with D   Exercise :  Plans to start     Mood    12/06/2022     3:05 PM 07/11/2022    3:59 PM 04/28/2020    3:03 PM  Depression screen PHQ 2/9  Decreased Interest 0 0 0  Down, Depressed, Hopeless 0 0 0  PHQ - 2 Score 0 0 0  Altered sleeping 0  1  Tired, decreased energy 0  0  Change in appetite 0  0  Feeling bad or failure about yourself  0  0  Trouble concentrating 0  0  Moving slowly or fidgety/restless 0  0  Suicidal thoughts 0  0  PHQ-9 Score 0  1  Difficult doing work/chores Not difficult at all  Not difficult at all   Has access to grief counseling if needed  Plans to get back to journaling  Good support system    Labs Lipid   Lab Results  Component Value Date   CHOL 170 11/20/2022   CHOL 140 04/28/2020   CHOL 139 01/22/2008   Lab Results  Component Value Date   HDL 61.90 11/20/2022   HDL 50.80 04/28/2020   HDL 41.8 01/22/2008   Lab Results  Component Value Date   LDLCALC 91 11/20/2022   LDLCALC 76 04/28/2020   LDLCALC 87 01/22/2008   Lab Results  Component Value Date  TRIG 87.0 11/20/2022   TRIG 66.0 04/28/2020   TRIG 53 01/22/2008   Lab Results  Component Value Date   CHOLHDL 3 11/20/2022   CHOLHDL 3 04/28/2020   CHOLHDL 3.3 CALC 01/22/2008   No results found for: "LDLDIRECT"  Balanced diet  Higher protein /lower carbs    Patient Active Problem List   Diagnosis Date Noted   Renal cyst 07/11/2022   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   Routine general medical examination at a health care facility 04/28/2020   Colon cancer screening 04/28/2020   Anxiety 01/21/2013   Insomnia 12/19/2011   Past Medical History:  Diagnosis Date   BRCA2 positive    Dysplastic nevus 07/09/2017   left upper flank   Past Surgical History:  Procedure Laterality Date   COLONOSCOPY WITH PROPOFOL N/A 07/26/2020   Procedure: COLONOSCOPY WITH PROPOFOL;  Surgeon: Wyline Mood, MD;  Location: [facility] ENDOSCOPY;  Service: Gastroenterology;  Laterality: N/A;   DIAGNOSTIC LAPAROSCOPY      LAPAROSCOPIC ENDOMETRIOSIS FULGURATION     TUBAL LIGATION     Social History   Tobacco Use   Smoking status: Never   Smokeless tobacco: Never  Vaping Use   Vaping status: Never Used  Substance Use Topics   Alcohol use: Yes    Comment: occasional wine   Drug use: No   Family History  Problem Relation Age of Onset   Cancer Maternal Grandmother        skin cancer on the vagina   Cancer Paternal Grandfather        prostate   No Known Allergies Current Outpatient Medications on File Prior to Visit  Medication Sig Dispense Refill   conjugated estrogens (PREMARIN) vaginal cream Place 1 applicator vaginally 2 (two) times a week.     No current facility-administered medications on file prior to visit.    Review of Systems  Constitutional:  Negative for activity change, appetite change, fatigue, fever and unexpected weight change.  HENT:  Negative for congestion, ear pain, rhinorrhea, sinus pressure and sore throat.   Eyes:  Negative for pain, redness and visual disturbance.  Respiratory:  Negative for cough, shortness of breath and wheezing.   Cardiovascular:  Negative for chest pain and palpitations.  Gastrointestinal:  Negative for abdominal pain, blood in stool, constipation and diarrhea.  Endocrine: Negative for polydipsia and polyuria.  Genitourinary:  Negative for dysuria, frequency and urgency.  Musculoskeletal:  Negative for arthralgias, back pain and myalgias.  Skin:  Negative for pallor and rash.  Allergic/Immunologic: Negative for environmental allergies.  Neurological:  Negative for dizziness, syncope and headaches.  Hematological:  Negative for adenopathy. Does not bruise/bleed easily.  Psychiatric/Behavioral:  Negative for decreased concentration and dysphoric mood. The patient is not nervous/anxious.        Grief/ just lost her father        Objective:   Physical Exam Constitutional:      General: She is not in acute distress.    Appearance: Normal  appearance. She is well-developed and normal weight. She is not ill-appearing or diaphoretic.  HENT:     Head: Normocephalic and atraumatic.     Right Ear: Tympanic membrane, ear canal and external ear normal.     Left Ear: Tympanic membrane, ear canal and external ear normal.     Nose: Nose normal. No congestion.     Mouth/Throat:     Mouth: Mucous membranes are moist.     Pharynx: Oropharynx is clear.  No posterior oropharyngeal erythema.  Eyes:     General: No scleral icterus.    Extraocular Movements: Extraocular movements intact.     Conjunctiva/sclera: Conjunctivae normal.     Pupils: Pupils are equal, round, and reactive to light.  Neck:     Thyroid: No thyromegaly.     Vascular: No carotid bruit or JVD.  Cardiovascular:     Rate and Rhythm: Normal rate and regular rhythm.     Pulses: Normal pulses.     Heart sounds: Normal heart sounds.     No gallop.  Pulmonary:     Effort: Pulmonary effort is normal. No respiratory distress.     Breath sounds: Normal breath sounds. No wheezing.     Comments: Good air exch Chest:     Chest wall: No tenderness.  Abdominal:     General: Bowel sounds are normal. There is no distension or abdominal bruit.     Palpations: Abdomen is soft. There is no mass.     Tenderness: There is no abdominal tenderness.     Hernia: No hernia is present.  Genitourinary:    Comments:              Anus appears normal w/o hemorrhoids or masses       External genitalia : nl appearance and hair distribution/no lesions       Urethral meatus : nl size, no lesions or prolapse       Urethra: no masses, tenderness or scarring      Bladder : no masses or tenderness       Vagina: nl general appearance, no discharge or  Lesions, no significant cystocele  or rectocele       Cervix: surg absent      Uterus: surg absent       Adnexa : surg absent            Musculoskeletal:        General: No tenderness. Normal range of motion.     Cervical back: Normal range  of motion and neck supple. No rigidity. No muscular tenderness.     Right lower leg: No edema.     Left lower leg: No edema.     Comments: No kyphosis   Lymphadenopathy:     Cervical: No cervical adenopathy.  Skin:    General: Skin is warm and dry.     Coloration: Skin is not pale.     Findings: No erythema or rash.     Comments: Solar lentigines diffusely   Neurological:     Mental Status: She is alert. Mental status is at baseline.     Cranial Nerves: No cranial nerve deficit.     Motor: No abnormal muscle tone.     Coordination: Coordination normal.     Gait: Gait normal.     Deep Tendon Reflexes: Reflexes are normal and symmetric. Reflexes normal.  Psychiatric:        Mood and Affect: Mood normal.        Cognition and Memory: Cognition and memory normal.           Assessment & Plan:   Problem List Items Addressed This Visit       Other   Surgical menopause    For BRAC gene  On HRT patch and vaginal estrogen cream for pelvic health   Normal bimanual exam today      Routine general medical examination at a health care facility - Primary    Reviewed  health habits including diet and exercise and skin cancer prevention Reviewed appropriate screening tests for age  Also reviewed health mt list, fam hx and immunization status , as well as social and family history   See HPI Labs reviewed and ordered Flu shot given  Declines hiv/hep c screen due to low risk  May consider shingrix in future  Utd breast cancer screen and planning proph mastectomy Pelvic exam done today Discussed fall prevention, supplements and exercise for bone density  Would consider dexa if ins covers in future / early hysterectomy PHQ 0  Health Maintenance  Topic Date Due   COVID-19 Vaccine (3 - Moderna risk series) 12/22/2022*   Zoster (Shingles) Vaccine (1 of 2) 03/08/2023*   Hepatitis C Screening  12/06/2023*   HIV Screening  12/06/2023*   Mammogram  05/01/2023   DTaP/Tdap/Td vaccine  (3 - Td or Tdap) 02/19/2024   Colon Cancer Screening  07/27/2027   Flu Shot  Completed   HPV Vaccine  Aged Out  *Topic was postponed. The date shown is not the original due date.         Insomnia    Woresned since hysterectomy  Emeline General cr 12.5 mg at bedtime prn with caution  Encouraged good sleep hygiene       Colon cancer screening    Colonoscopy due for 7 year recall in 2029      BRCA gene mutation positive    Has had total hysterectomy Also planning mastectomy in January  On HRT patch low dose Goes to [facility] for oncology follow up and monitoring  Has had MRI and mammogram in past 12 mo       Anxiety    Doing well despite grief /recent loss of father       Other Visit Diagnoses     Need for influenza vaccination       Relevant Orders   Flu vaccine trivalent PF, 6mos and older(Flulaval,Afluria,Fluarix,Fluzone) (Completed)

## 2022-12-06 ENCOUNTER — Encounter: Payer: Self-pay | Admitting: Family Medicine

## 2022-12-06 ENCOUNTER — Ambulatory Visit (INDEPENDENT_AMBULATORY_CARE_PROVIDER_SITE_OTHER): Payer: Federal, State, Local not specified - PPO | Admitting: Family Medicine

## 2022-12-06 VITALS — BP 130/76 | HR 70 | Temp 98.1°F | Ht 65.0 in | Wt 165.0 lb

## 2022-12-06 DIAGNOSIS — F5101 Primary insomnia: Secondary | ICD-10-CM | POA: Diagnosis not present

## 2022-12-06 DIAGNOSIS — Z1501 Genetic susceptibility to malignant neoplasm of breast: Secondary | ICD-10-CM

## 2022-12-06 DIAGNOSIS — Z1211 Encounter for screening for malignant neoplasm of colon: Secondary | ICD-10-CM

## 2022-12-06 DIAGNOSIS — E8940 Asymptomatic postprocedural ovarian failure: Secondary | ICD-10-CM

## 2022-12-06 DIAGNOSIS — Z1509 Genetic susceptibility to other malignant neoplasm: Secondary | ICD-10-CM

## 2022-12-06 DIAGNOSIS — Z1589 Genetic susceptibility to other disease: Secondary | ICD-10-CM

## 2022-12-06 DIAGNOSIS — Z23 Encounter for immunization: Secondary | ICD-10-CM

## 2022-12-06 DIAGNOSIS — F419 Anxiety disorder, unspecified: Secondary | ICD-10-CM

## 2022-12-06 DIAGNOSIS — Z0000 Encounter for general adult medical examination without abnormal findings: Secondary | ICD-10-CM

## 2022-12-06 MED ORDER — ESTRADIOL 0.05 MG/24HR TD PTTW: 1.0000 | MEDICATED_PATCH | TRANSDERMAL | 3 refills | Status: AC

## 2022-12-06 MED ORDER — ZOLPIDEM TARTRATE ER 12.5 MG PO TBCR: 12.5000 mg | EXTENDED_RELEASE_TABLET | Freq: Every evening | ORAL | 1 refills | Status: DC | PRN

## 2022-12-06 NOTE — Assessment & Plan Note (Signed)
For [facility] gene  On HRT patch and vaginal estrogen cream for pelvic health   Normal bimanual exam today

## 2022-12-06 NOTE — Assessment & Plan Note (Signed)
Doing well despite grief /recent loss of father

## 2022-12-06 NOTE — Patient Instructions (Addendum)
Flu shot today   For future If you are interested in the shingles vaccine series (Shingrix), call your insurance or pharmacy to check on coverage and location it must be given.  If affordable - you can schedule it here or at your pharmacy depending on coverage    Aim for 5 days per week of exercise (30 or more minutes)  Cardio More importantly strength training Add some strength training to your routine, this is important for bone and brain health and can reduce your risk of falls and help your body use insulin properly and regulate weight  Light weights, exercise bands , and internet videos are a good way to start  Yoga (chair or regular), machines , floor exercises or a gym with machines are also good options    I would love to get a bone density test next year  See if your insurance company covers a bone density test under 65   Try to get 1200-1500 mg of calcium per day with at least 2000 iu of vitamin D - for bone health  Take care of yourself

## 2022-12-06 NOTE — Assessment & Plan Note (Addendum)
Reviewed health habits including diet and exercise and skin cancer prevention Reviewed appropriate screening tests for age  Also reviewed health mt list, fam hx and immunization status , as well as social and family history   See HPI Labs reviewed and ordered Flu shot given  Declines hiv/hep c screen due to low risk  May consider shingrix in future  Utd breast cancer screen and planning proph mastectomy Pelvic exam done today Discussed fall prevention, supplements and exercise for bone density  Would consider dexa if ins covers in future / early hysterectomy PHQ 0  Health Maintenance  Topic Date Due   COVID-19 Vaccine (3 - Moderna risk series) 12/22/2022*   Zoster (Shingles) Vaccine (1 of 2) 03/08/2023*   Hepatitis C Screening  12/06/2023*   HIV Screening  12/06/2023*   Mammogram  05/01/2023   DTaP/Tdap/Td vaccine (3 - Td or Tdap) 02/19/2024   Colon Cancer Screening  07/27/2027   Flu Shot  Completed   HPV Vaccine  Aged Out  *Topic was postponed. The date shown is not the original due date.

## 2022-12-06 NOTE — Assessment & Plan Note (Signed)
Has had total hysterectomy Also planning mastectomy in January  On HRT patch low dose Goes to [facility] for oncology follow up and monitoring  Has had MRI and mammogram in past 12 mo

## 2022-12-06 NOTE — Assessment & Plan Note (Signed)
Colonoscopy due for 7 year recall in 2029

## 2022-12-06 NOTE — Assessment & Plan Note (Signed)
Woresned since hysterectomy  Takes ambien cr 12.5 mg at bedtime prn with caution  Encouraged good sleep hygiene

## 2023-02-16 DIAGNOSIS — N281 Cyst of kidney, acquired: Secondary | ICD-10-CM | POA: Diagnosis not present

## 2023-02-16 DIAGNOSIS — Z1509 Genetic susceptibility to other malignant neoplasm: Secondary | ICD-10-CM | POA: Diagnosis not present

## 2023-02-16 DIAGNOSIS — Z1501 Genetic susceptibility to malignant neoplasm of breast: Secondary | ICD-10-CM | POA: Diagnosis not present

## 2023-02-16 DIAGNOSIS — Z9189 Other specified personal risk factors, not elsewhere classified: Secondary | ICD-10-CM | POA: Diagnosis not present

## 2023-02-26 DIAGNOSIS — Z1501 Genetic susceptibility to malignant neoplasm of breast: Secondary | ICD-10-CM | POA: Diagnosis not present

## 2023-02-26 DIAGNOSIS — Z1509 Genetic susceptibility to other malignant neoplasm: Secondary | ICD-10-CM | POA: Diagnosis not present

## 2023-02-26 DIAGNOSIS — Z9889 Other specified postprocedural states: Secondary | ICD-10-CM | POA: Diagnosis not present

## 2023-02-26 DIAGNOSIS — Z09 Encounter for follow-up examination after completed treatment for conditions other than malignant neoplasm: Secondary | ICD-10-CM | POA: Diagnosis not present

## 2023-03-09 ENCOUNTER — Encounter: Payer: Self-pay | Admitting: Family Medicine

## 2023-03-09 ENCOUNTER — Telehealth: Payer: Self-pay | Admitting: *Deleted

## 2023-03-09 NOTE — Telephone Encounter (Signed)
Pt sent a message saying:  Dr. Milinda Antis,  My mastectomy is scheduled for March 11. I'm ready to get it behind me. 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 I think the last year of dealing with my dad's sickness and death and my own health issues has caught up with me. All I want to do is sleep. I think I'm depressed. I'm not sure now is the right time to start medication but wanted your thoughts.   I also started a compounded GLP-1 about 5 weeks ago trying to get the 10-15 pounds off I have gained in the last few months  before surgery (it is working).   I'm now required to be in [place] 5 days a week for work (no longer allowed to work from home) so there is no way I can come in for a doctor's appt before surgery.   I just wanted your opinion/thoughts if you think a low dose medication might help.

## 2023-03-09 NOTE — Telephone Encounter (Signed)
Called pt and schedule appt for as a mychart  visit

## 2023-03-09 NOTE — Telephone Encounter (Signed)
 Pt scheduled virtual visit.

## 2023-03-09 NOTE — Telephone Encounter (Signed)
 Please schedule an appointment, thanks

## 2023-03-14 ENCOUNTER — Encounter: Payer: Self-pay | Admitting: Family Medicine

## 2023-03-14 ENCOUNTER — Telehealth (INDEPENDENT_AMBULATORY_CARE_PROVIDER_SITE_OTHER): Payer: Federal, State, Local not specified - PPO | Admitting: Family Medicine

## 2023-03-14 VITALS — Ht 65.0 in | Wt 154.2 lb

## 2023-03-14 DIAGNOSIS — F4329 Adjustment disorder with other symptoms: Secondary | ICD-10-CM | POA: Insufficient documentation

## 2023-03-14 MED ORDER — FLUOXETINE HCL 10 MG PO CAPS: 10.0000 mg | ORAL_CAPSULE | Freq: Every day | ORAL | 1 refills | Status: DC

## 2023-03-14 NOTE — Assessment & Plan Note (Signed)
 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 If side effects of mood change)  Some anx and some dep symptoms  Reviewed stressors/ coping techniques/symptoms/ support sources/ tx options and side effects in detail today  Discussed opt for treatment  Will reach out to her christian counselor  Start fluoxetine 10 mg daily  Discussed expectations of SSRI medication including time to effectiveness and mechanism of action, also poss of side effects (early and late)- including mental fuzziness, weight or appetite change, nausea and poss of worse dep or anxiety (even suicidal thoughts)  Pt voiced understanding and will stop med and update if this occurs    Encouraged start of exercise  Follow up 1 month or earlier if needed

## 2023-03-14 NOTE — Progress Notes (Signed)
 Virtual Visit via Video Note  I connected with Lilyan Gilford on 03/14/23 at  8:30 AM EST by a video enabled telemedicine application and verified that I am speaking with the correct person using two identifiers.  Patient Location: Home Provider Location: Office/Clinic  I discussed the limitations, risks, security, and privacy concerns of performing an evaluation and management service by video and the availability of in person appointments. I also discussed with the patient that there may be a patient responsible charge related to this service. The patient expressed understanding and agreed to proceed.  Parties involved in encounter  Patient: Elizabeth Sheppard   Provider:  Roxy Manns MD   Subjective: PCP: Judy Pimple, MD  Chief Complaint  Patient presents with   Depression   HPI Pt presents with c/o depressed mood   Wt Readings from Last 3 Encounters:  03/14/23 154 lb 4 oz (70 kg)  12/06/22 165 lb (74.8 kg)  07/11/22 159 lb 12.8 oz (72.5 kg)   25.67 kg/m    Wants to sleep all the time  But still anxious   Coping mechanism- faith  Not drinking any alcohol at all /does not want it  Very strong support/family   Eating 1200-1400 cal daily-a lot of protein  Needs to figure out exercise before surgery  Trying to keep a regular sleep schedule   Has done talk therapy in the past /a while ago   In surgical menopause  Hysterectomy for [facility] gene   (mastectomy scheduled for march 11)  Taking vivelle dot .05  Premarin vaginal cream   Ambien prn for insomnia   Stressors [employer] employee uncertain about career (angry and anxious)  Lost her father  Shari Heritage is getting married also and will move away    [employer] on med list (is compounded) -that has really helped  Trying to loose the 10-15 lb she gained in the few months before surgery  Using NOOM       03/14/2023    8:23 AM 12/06/2022    3:05 PM 07/11/2022    3:59 PM 04/28/2020    3:03 PM  Depression  screen PHQ 2/9  Decreased Interest 2 0 0 0  Down, Depressed, Hopeless 2 0 0 0  PHQ - 2 Score 4 0 0 0  Altered sleeping 2 0  1  Tired, decreased energy 2 0  0  Change in appetite 0 0  0  Feeling bad or failure about yourself  0 0  0  Trouble concentrating 0 0  0  Moving slowly or fidgety/restless 0 0  0  Suicidal thoughts 0 0  0  PHQ-9 Score 8 0  1  Difficult doing work/chores Not difficult at all Not difficult at all  Not difficult at all      03/14/2023    8:25 AM 12/06/2022    3:05 PM  GAD 7 : Generalized Anxiety Score  Nervous, Anxious, on Edge 3 0  Control/stop worrying 3 0  Worry too much - different things 3 0  Trouble relaxing 0 0  Restless 0 0  Easily annoyed or irritable 1 0  Afraid - awful might happen 3 0  Total GAD 7 Score 13 0  Anxiety Difficulty Not difficult at all Not difficult at all      ROS: Per HPI Review of Systems  Constitutional:  Positive for malaise/fatigue.  Psychiatric/Behavioral:  Positive for depression. Negative for substance abuse and suicidal ideas. The patient is nervous/anxious.  Current Outpatient Medications:    conjugated estrogens (PREMARIN) vaginal cream, Place 1 applicator vaginally 2 (two) times a week., Disp: , Rfl:    estradiol (VIVELLE-DOT) 0.05 MG/24HR patch, Place 1 patch (0.05 mg total) onto the skin 2 (two) times a week., Disp: 24 patch, Rfl: 3   FLUoxetine (PROZAC) 10 MG capsule, Take 1 capsule (10 mg total) by mouth daily., Disp: 30 capsule, Rfl: 1   zolpidem (AMBIEN CR) 12.5 MG CR tablet, Take 1 tablet (12.5 mg total) by mouth at bedtime as needed. for sleep, Disp: 30 tablet, Rfl: 1   Semaglutide,0.25 or 0.5MG /DOS, 2 MG/3ML SOPN, Inject 0.5 mg into the skin once a week., Disp: , Rfl:   Observations/Objective: Today's Vitals   03/14/23 0823  Weight: 154 lb 4 oz (70 kg)  Height: 5\' 5"  (1.651 m)   Physical Exam Patient appears well, in no distress Weight is baseline  No facial swelling or asymmetry Normal  voice-not hoarse and no slurred speech No obvious tremor or mobility impairment Moving neck and UEs normally Able to hear the call well  No cough or shortness of breath during interview  Talkative and mentally sharp with no cognitive changes No skin changes on face or neck , no rash or pallor Affect is mildly anxious    Assessment and Plan: Adjustment disorder with emotional disturbance Assessment & Plan: Stressors include 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 If side effects of mood change)  Some anx and some dep symptoms  Reviewed stressors/ coping techniques/symptoms/ support sources/ tx options and side effects in detail today  Discussed opt for treatment  Will reach out to her christian counselor  Start fluoxetine 10 mg daily  Discussed expectations of SSRI medication including time to effectiveness and mechanism of action, also poss of side effects (early and late)- including mental fuzziness, weight or appetite change, nausea and poss of worse dep or anxiety (even suicidal thoughts)  Pt voiced understanding and will stop med and update if this occurs    Encouraged start of exercise  Follow up 1 month or earlier if needed    Other orders -     FLUoxetine HCl; Take 1 capsule (10 mg total) by mouth daily.  Dispense: 30 capsule; Refill: 1    Follow Up Instructions: No follow-ups on file.  Go ahead and get established with a mental health counselor and let us know if you need help with that   Start the generic prozac 10 mg daily in the am If you feel worse or have any intolerable side effects hold it and call   Work on self care Start some regular exercise (before surgery)  Schedule follow up in 1 month or earlier if needed    I discussed the assessment and  treatment plan with the patient. The patient was provided an opportunity to ask questions, and all were answered. The patient agreed with the plan and demonstrated an understanding of the instructions.   The patient was advised to call back or seek an in-person evaluation if the symptoms worsen or if the condition fails to improve as anticipated.  The above assessment and management plan was discussed with the patient. The patient verbalized understanding of and has agreed to the management plan.   Roxy Manns, MD

## 2023-03-14 NOTE — Patient Instructions (Addendum)
 Go ahead and get established with a mental health counselor and let us know if you need help with that   Start the generic prozac 10 mg daily in the am If you feel worse or have any intolerable side effects hold it and call   Work on self care Start some regular exercise (before surgery)  Schedule follow up in 1 month or earlier if needed

## 2023-03-15 DIAGNOSIS — Z1509 Genetic susceptibility to other malignant neoplasm: Secondary | ICD-10-CM | POA: Diagnosis not present

## 2023-03-15 DIAGNOSIS — Z1501 Genetic susceptibility to malignant neoplasm of breast: Secondary | ICD-10-CM | POA: Diagnosis not present

## 2023-03-15 DIAGNOSIS — Z01818 Encounter for other preprocedural examination: Secondary | ICD-10-CM | POA: Diagnosis not present

## 2023-03-15 DIAGNOSIS — Z9189 Other specified personal risk factors, not elsewhere classified: Secondary | ICD-10-CM | POA: Diagnosis not present

## 2023-03-15 DIAGNOSIS — Z1239 Encounter for other screening for malignant neoplasm of breast: Secondary | ICD-10-CM | POA: Diagnosis not present

## 2023-03-15 DIAGNOSIS — Z803 Family history of malignant neoplasm of breast: Secondary | ICD-10-CM | POA: Diagnosis not present

## 2023-03-19 DIAGNOSIS — Z1501 Genetic susceptibility to malignant neoplasm of breast: Secondary | ICD-10-CM | POA: Diagnosis not present

## 2023-03-19 DIAGNOSIS — Z01818 Encounter for other preprocedural examination: Secondary | ICD-10-CM | POA: Diagnosis not present

## 2023-03-19 DIAGNOSIS — Z1379 Encounter for other screening for genetic and chromosomal anomalies: Secondary | ICD-10-CM | POA: Diagnosis not present

## 2023-03-19 DIAGNOSIS — Z1509 Genetic susceptibility to other malignant neoplasm: Secondary | ICD-10-CM | POA: Diagnosis not present

## 2023-03-27 ENCOUNTER — Encounter: Payer: Federal, State, Local not specified - PPO | Admitting: Dermatology

## 2023-03-27 DIAGNOSIS — Z79899 Other long term (current) drug therapy: Secondary | ICD-10-CM | POA: Diagnosis not present

## 2023-03-27 DIAGNOSIS — Z1509 Genetic susceptibility to other malignant neoplasm: Secondary | ICD-10-CM | POA: Diagnosis not present

## 2023-03-27 DIAGNOSIS — Z853 Personal history of malignant neoplasm of breast: Secondary | ICD-10-CM | POA: Diagnosis not present

## 2023-03-27 DIAGNOSIS — Z803 Family history of malignant neoplasm of breast: Secondary | ICD-10-CM | POA: Diagnosis not present

## 2023-03-27 DIAGNOSIS — Z1501 Genetic susceptibility to malignant neoplasm of breast: Secondary | ICD-10-CM | POA: Diagnosis not present

## 2023-03-27 DIAGNOSIS — Z79890 Hormone replacement therapy (postmenopausal): Secondary | ICD-10-CM | POA: Diagnosis not present

## 2023-03-27 DIAGNOSIS — Z4001 Encounter for prophylactic removal of breast: Secondary | ICD-10-CM | POA: Diagnosis not present

## 2023-03-27 DIAGNOSIS — N6012 Diffuse cystic mastopathy of left breast: Secondary | ICD-10-CM | POA: Diagnosis not present

## 2023-03-27 DIAGNOSIS — Z421 Encounter for breast reconstruction following mastectomy: Secondary | ICD-10-CM | POA: Diagnosis not present

## 2023-03-27 DIAGNOSIS — N6011 Diffuse cystic mastopathy of right breast: Secondary | ICD-10-CM | POA: Diagnosis not present

## 2023-03-27 DIAGNOSIS — Z7985 Long-term (current) use of injectable non-insulin antidiabetic drugs: Secondary | ICD-10-CM | POA: Diagnosis not present

## 2023-03-28 DIAGNOSIS — Z4001 Encounter for prophylactic removal of breast: Secondary | ICD-10-CM | POA: Diagnosis not present

## 2023-03-28 DIAGNOSIS — Z79899 Other long term (current) drug therapy: Secondary | ICD-10-CM | POA: Diagnosis not present

## 2023-03-28 DIAGNOSIS — Z803 Family history of malignant neoplasm of breast: Secondary | ICD-10-CM | POA: Diagnosis not present

## 2023-03-28 DIAGNOSIS — Z7985 Long-term (current) use of injectable non-insulin antidiabetic drugs: Secondary | ICD-10-CM | POA: Diagnosis not present

## 2023-03-28 DIAGNOSIS — Z1501 Genetic susceptibility to malignant neoplasm of breast: Secondary | ICD-10-CM | POA: Diagnosis not present

## 2023-03-28 DIAGNOSIS — Z79890 Hormone replacement therapy (postmenopausal): Secondary | ICD-10-CM | POA: Diagnosis not present

## 2023-03-28 DIAGNOSIS — Z1509 Genetic susceptibility to other malignant neoplasm: Secondary | ICD-10-CM | POA: Diagnosis not present

## 2023-04-05 ENCOUNTER — Other Ambulatory Visit: Payer: Self-pay | Admitting: Family Medicine

## 2023-04-05 NOTE — Telephone Encounter (Signed)
 Pt is due for her 1 month f/u after starting this med. Doesn't look like pt scheduled appt after last OV., please schedule 1 month f/u (can be virtual if need be)

## 2023-04-06 DIAGNOSIS — Z9013 Acquired absence of bilateral breasts and nipples: Secondary | ICD-10-CM | POA: Diagnosis not present

## 2023-04-06 DIAGNOSIS — Z1509 Genetic susceptibility to other malignant neoplasm: Secondary | ICD-10-CM | POA: Diagnosis not present

## 2023-04-06 DIAGNOSIS — Z1501 Genetic susceptibility to malignant neoplasm of breast: Secondary | ICD-10-CM | POA: Diagnosis not present

## 2023-04-06 NOTE — Telephone Encounter (Signed)
 Lvmtcb, sent mychart messages

## 2023-04-09 NOTE — Telephone Encounter (Signed)
 lvmtcb

## 2023-04-10 DIAGNOSIS — Z1501 Genetic susceptibility to malignant neoplasm of breast: Secondary | ICD-10-CM | POA: Diagnosis not present

## 2023-04-10 DIAGNOSIS — Z1509 Genetic susceptibility to other malignant neoplasm: Secondary | ICD-10-CM | POA: Diagnosis not present

## 2023-04-10 DIAGNOSIS — Z9013 Acquired absence of bilateral breasts and nipples: Secondary | ICD-10-CM | POA: Diagnosis not present

## 2023-04-10 NOTE — Telephone Encounter (Signed)
 Patient scheduled.

## 2023-04-12 ENCOUNTER — Encounter: Payer: Self-pay | Admitting: Family Medicine

## 2023-04-12 ENCOUNTER — Telehealth (INDEPENDENT_AMBULATORY_CARE_PROVIDER_SITE_OTHER): Admitting: Family Medicine

## 2023-04-12 VITALS — Wt 151.0 lb

## 2023-04-12 DIAGNOSIS — F5101 Primary insomnia: Secondary | ICD-10-CM | POA: Diagnosis not present

## 2023-04-12 DIAGNOSIS — Z79890 Hormone replacement therapy (postmenopausal): Secondary | ICD-10-CM | POA: Diagnosis not present

## 2023-04-12 DIAGNOSIS — F419 Anxiety disorder, unspecified: Secondary | ICD-10-CM | POA: Diagnosis not present

## 2023-04-12 DIAGNOSIS — F4329 Adjustment disorder with other symptoms: Secondary | ICD-10-CM

## 2023-04-12 MED ORDER — PROGESTERONE MICRONIZED 100 MG PO CAPS: 100.0000 mg | ORAL_CAPSULE | Freq: Every day | ORAL | 3 refills | Status: DC

## 2023-04-12 MED ORDER — FLUOXETINE HCL 10 MG PO CAPS: 10.0000 mg | ORAL_CAPSULE | Freq: Every day | ORAL | 3 refills | Status: DC

## 2023-04-12 NOTE — Assessment & Plan Note (Signed)
 Doing much better with decrease in stress (mastectomy and hysterectomy are over and still recovering away from work) Also fluoxetine 10 mg is helping and tolerates it well Reviewed stressors/ coping techniques/symptoms/ support sources/ tx options and side effects in detail today  I suspect adding progesterone to her estrogen (surgical menopause) will help sleep and anxiety even more

## 2023-04-12 NOTE — Assessment & Plan Note (Signed)
 Ambien prn  Fluoxetine has helped anxiety   Was given ok from gyn to start oral progesterone to go with estrogen This may help also

## 2023-04-12 NOTE — Assessment & Plan Note (Addendum)
 Improved with less stress and fluoxetine  Will continue this  Also try progesterone (already on estradiol) now that she is s/p hysterectomy and mastectomy)

## 2023-04-12 NOTE — Assessment & Plan Note (Signed)
 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

## 2023-04-12 NOTE — Progress Notes (Signed)
 Virtual Visit via Video Note  I connected with Lilyan Gilford on 04/12/23 at  9:30 AM EDT by a video enabled telemedicine application and verified that I am speaking with the correct person using two identifiers.  Patient Location: Home Provider Location: Office/Clinic  I discussed the limitations, risks, security, and privacy concerns of performing an evaluation and management service by video and the availability of in person appointments. I also discussed with the patient that there may be a patient responsible charge related to this service. The patient expressed understanding and agreed to proceed.  Parties involved in encounter  Patient: Elizabeth Sheppard   Provider:  Roxy Manns MD   Subjective: PCP: Judy Pimple, MD  Chief Complaint  Patient presents with   Medical Management of Chronic Issues   HPI Pt presents for follow up of depressed /anxious mood (adjustment disorder) In the setting of surgical menopause   Has had mastectomy / for [facility] gene  It went great  Still has drains and getting over pain  Biposies were clear   Emotionally doing well Enjoying break from work  More balanced for now   Thinks the fluoxetine is helping some  Feels like herself  Does not want to go up on dose  No side effects   Has not had time to reach out to counseling  May do this when she goes back to work    On HRT Also ambien for insomnia    Last visit we started fluoxetine 10 mg daily  Planned to reach out to [employer] counselor as well   Last visit     03/14/2023    8:23 AM 12/06/2022    3:05 PM 07/11/2022    3:59 PM 04/28/2020    3:03 PM  Depression screen PHQ 2/9  Decreased Interest 2 0 0 0  Down, Depressed, Hopeless 2 0 0 0  PHQ - 2 Score 4 0 0 0  Altered sleeping 2 0  1  Tired, decreased energy 2 0  0  Change in appetite 0 0  0  Feeling bad or failure about yourself  0 0  0  Trouble concentrating 0 0  0  Moving slowly or fidgety/restless 0 0  0   Suicidal thoughts 0 0  0  PHQ-9 Score 8 0  1  Difficult doing work/chores Not difficult at all Not difficult at all  Not difficult at all      03/14/2023    8:25 AM 12/06/2022    3:05 PM  GAD 7 : Generalized Anxiety Score  Nervous, Anxious, on Edge 3 0  Control/stop worrying 3 0  Worry too much - different things 3 0  Trouble relaxing 0 0  Restless 0 0  Easily annoyed or irritable 1 0  Afraid - awful might happen 3 0  Total GAD 7 Score 13 0  Anxiety Difficulty Not difficult at all Not difficult at all       ROS: Per HPI Review of Systems  Constitutional:  Positive for malaise/fatigue. Negative for chills and fever.  HENT:  Negative for congestion, ear pain, sinus pain and sore throat.   Eyes:  Negative for blurred vision, discharge and redness.  Respiratory:  Negative for cough, shortness of breath and stridor.   Cardiovascular:  Negative for chest pain, palpitations and leg swelling.  Gastrointestinal:  Negative for abdominal pain, diarrhea, nausea and vomiting.  Musculoskeletal:  Negative for myalgias.  Skin:  Negative for rash.  Neurological:  Negative for  dizziness and headaches.  Psychiatric/Behavioral:  Positive for depression. The patient is nervous/anxious.        Mood is overall improved Less anxious and down    Current Outpatient Medications:    conjugated estrogens (PREMARIN) vaginal cream, Place 1 applicator vaginally 2 (two) times a week., Disp: , Rfl:    estradiol (VIVELLE-DOT) 0.05 MG/24HR patch, Place 1 patch (0.05 mg total) onto the skin 2 (two) times a week., Disp: 24 patch, Rfl: 3   oxyCODONE (OXY IR/ROXICODONE) 5 MG immediate release tablet, Take 5 mg by mouth 4 (four) times daily as needed., Disp: , Rfl:    progesterone (PROMETRIUM) 100 MG capsule, Take 1 capsule (100 mg total) by mouth daily. In evening, Disp: 90 capsule, Rfl: 3   Semaglutide,0.25 or 0.5MG /DOS, 2 MG/3ML SOPN, Inject 0.5 mg into the skin once a week., Disp: , Rfl:    zolpidem (AMBIEN  CR) 12.5 MG CR tablet, Take 1 tablet (12.5 mg total) by mouth at bedtime as needed. for sleep, Disp: 30 tablet, Rfl: 1   FLUoxetine (PROZAC) 10 MG capsule, Take 1 capsule (10 mg total) by mouth daily., Disp: 90 capsule, Rfl: 3  Observations/Objective: Today's Vitals   04/12/23 0933  Weight: 151 lb (68.5 kg)   Physical Exam Patient appears well, in no distress Weight is baseline  No facial swelling or asymmetry Normal voice-not hoarse and no slurred speech No obvious tremor or mobility impairment Moving neck and UEs normally Able to hear the call well  No cough or shortness of breath during interview  Talkative and mentally sharp with no cognitive changes No skin changes on face or neck , no rash or pallor Affect is normal   Assessment and Plan: Adjustment disorder with emotional disturbance Assessment & Plan: Doing much better with decrease in stress (mastectomy and hysterectomy are over and still recovering away from work) Also fluoxetine 10 mg is helping and tolerates it well Reviewed stressors/ coping techniques/symptoms/ support sources/ tx options and side effects in detail today  I suspect adding progesterone to her estrogen (surgical menopause) will help sleep and anxiety even more     Primary insomnia Assessment & Plan: Ambien prn  Fluoxetine has helped anxiety   Was given ok from gyn to start oral progesterone to go with estrogen This may help also    Anxiety Assessment & Plan: Improved with less stress and fluoxetine  Will continue this  Also try progesterone (already on estradiol) now that she is s/p hysterectomy and mastectomy)    Hormone replacement therapy (HRT) Assessment & Plan: Surgical menopause in early 44s  Total hysterectomy and mastectomy with [facility] gene Gyn gave go ahead for HRT   Vivelle dot 0.05 mg patch twice weekly  Will add progesterone 100 mg daily in evening for sleep and mood    Other orders -     FLUoxetine HCl; Take 1 capsule  (10 mg total) by mouth daily.  Dispense: 90 capsule; Refill: 3 -     Progesterone; Take 1 capsule (100 mg total) by mouth daily. In evening  Dispense: 90 capsule; Refill: 3    Follow Up Instructions: No follow-ups on file.  Continue fluoxetine at this dose  Let us know if mood changes   Add progesterone 100 mg in the evening  I hope this further helps mood and sleep I discussed the assessment and treatment plan with the patient. The patient was provided an opportunity to ask questions, and all were answered. The patient agreed with the  plan and demonstrated an understanding of the instructions.   The patient was advised to call back or seek an in-person evaluation if the symptoms worsen or if the condition fails to improve as anticipated.  The above assessment and management plan was discussed with the patient. The patient verbalized understanding of and has agreed to the management plan.   Roxy Manns, MD

## 2023-04-12 NOTE — Patient Instructions (Signed)
 Continue fluoxetine at this dose  Let us know if mood changes   Add progesterone 100 mg in the evening  I hope this further helps mood and sleep

## 2023-04-16 DIAGNOSIS — Z1501 Genetic susceptibility to malignant neoplasm of breast: Secondary | ICD-10-CM | POA: Diagnosis not present

## 2023-04-16 DIAGNOSIS — Z9013 Acquired absence of bilateral breasts and nipples: Secondary | ICD-10-CM | POA: Diagnosis not present

## 2023-04-16 DIAGNOSIS — Z1509 Genetic susceptibility to other malignant neoplasm: Secondary | ICD-10-CM | POA: Diagnosis not present

## 2023-04-23 DIAGNOSIS — Z1509 Genetic susceptibility to other malignant neoplasm: Secondary | ICD-10-CM | POA: Diagnosis not present

## 2023-04-23 DIAGNOSIS — Z9013 Acquired absence of bilateral breasts and nipples: Secondary | ICD-10-CM | POA: Diagnosis not present

## 2023-04-23 DIAGNOSIS — Z1501 Genetic susceptibility to malignant neoplasm of breast: Secondary | ICD-10-CM | POA: Diagnosis not present

## 2023-04-27 DIAGNOSIS — Z1501 Genetic susceptibility to malignant neoplasm of breast: Secondary | ICD-10-CM | POA: Diagnosis not present

## 2023-04-27 DIAGNOSIS — Z1379 Encounter for other screening for genetic and chromosomal anomalies: Secondary | ICD-10-CM | POA: Diagnosis not present

## 2023-04-27 DIAGNOSIS — Z9013 Acquired absence of bilateral breasts and nipples: Secondary | ICD-10-CM | POA: Diagnosis not present

## 2023-04-27 DIAGNOSIS — Z7189 Other specified counseling: Secondary | ICD-10-CM | POA: Diagnosis not present

## 2023-04-27 DIAGNOSIS — Z1509 Genetic susceptibility to other malignant neoplasm: Secondary | ICD-10-CM | POA: Diagnosis not present

## 2023-05-02 DIAGNOSIS — B9689 Other specified bacterial agents as the cause of diseases classified elsewhere: Secondary | ICD-10-CM | POA: Diagnosis not present

## 2023-05-02 DIAGNOSIS — Z1501 Genetic susceptibility to malignant neoplasm of breast: Secondary | ICD-10-CM | POA: Diagnosis not present

## 2023-05-02 DIAGNOSIS — Z7985 Long-term (current) use of injectable non-insulin antidiabetic drugs: Secondary | ICD-10-CM | POA: Diagnosis not present

## 2023-05-02 DIAGNOSIS — Z79899 Other long term (current) drug therapy: Secondary | ICD-10-CM | POA: Diagnosis not present

## 2023-05-02 DIAGNOSIS — T8579XA Infection and inflammatory reaction due to other internal prosthetic devices, implants and grafts, initial encounter: Secondary | ICD-10-CM | POA: Diagnosis not present

## 2023-05-02 DIAGNOSIS — Y836 Removal of other organ (partial) (total) as the cause of abnormal reaction of the patient, or of later complication, without mention of misadventure at the time of the procedure: Secondary | ICD-10-CM | POA: Diagnosis not present

## 2023-05-02 DIAGNOSIS — I959 Hypotension, unspecified: Secondary | ICD-10-CM | POA: Diagnosis not present

## 2023-05-02 DIAGNOSIS — Y831 Surgical operation with implant of artificial internal device as the cause of abnormal reaction of the patient, or of later complication, without mention of misadventure at the time of the procedure: Secondary | ICD-10-CM | POA: Diagnosis not present

## 2023-05-02 DIAGNOSIS — T8141XA Infection following a procedure, superficial incisional surgical site, initial encounter: Secondary | ICD-10-CM | POA: Diagnosis not present

## 2023-05-02 DIAGNOSIS — Z9013 Acquired absence of bilateral breasts and nipples: Secondary | ICD-10-CM | POA: Diagnosis not present

## 2023-05-02 DIAGNOSIS — N61 Mastitis without abscess: Secondary | ICD-10-CM | POA: Diagnosis not present

## 2023-05-07 DIAGNOSIS — Z48817 Encounter for surgical aftercare following surgery on the skin and subcutaneous tissue: Secondary | ICD-10-CM | POA: Diagnosis not present

## 2023-05-07 DIAGNOSIS — Z1509 Genetic susceptibility to other malignant neoplasm: Secondary | ICD-10-CM | POA: Diagnosis not present

## 2023-05-07 DIAGNOSIS — Z1501 Genetic susceptibility to malignant neoplasm of breast: Secondary | ICD-10-CM | POA: Diagnosis not present

## 2023-05-07 DIAGNOSIS — Z9889 Other specified postprocedural states: Secondary | ICD-10-CM | POA: Diagnosis not present

## 2023-05-07 DIAGNOSIS — Z9013 Acquired absence of bilateral breasts and nipples: Secondary | ICD-10-CM | POA: Diagnosis not present

## 2023-05-15 DIAGNOSIS — Z1501 Genetic susceptibility to malignant neoplasm of breast: Secondary | ICD-10-CM | POA: Diagnosis not present

## 2023-05-15 DIAGNOSIS — Z1509 Genetic susceptibility to other malignant neoplasm: Secondary | ICD-10-CM | POA: Diagnosis not present

## 2023-05-15 DIAGNOSIS — Z9013 Acquired absence of bilateral breasts and nipples: Secondary | ICD-10-CM | POA: Diagnosis not present

## 2023-05-28 DIAGNOSIS — Z9013 Acquired absence of bilateral breasts and nipples: Secondary | ICD-10-CM | POA: Diagnosis not present

## 2023-05-28 DIAGNOSIS — Z1501 Genetic susceptibility to malignant neoplasm of breast: Secondary | ICD-10-CM | POA: Diagnosis not present

## 2023-05-28 DIAGNOSIS — Z1509 Genetic susceptibility to other malignant neoplasm: Secondary | ICD-10-CM | POA: Diagnosis not present

## 2023-06-05 ENCOUNTER — Ambulatory Visit (INDEPENDENT_AMBULATORY_CARE_PROVIDER_SITE_OTHER): Admitting: Nurse Practitioner

## 2023-06-05 ENCOUNTER — Telehealth: Payer: Self-pay | Admitting: Nurse Practitioner

## 2023-06-05 VITALS — BP 98/82 | HR 66 | Temp 98.0°F | Ht 65.0 in | Wt 154.4 lb

## 2023-06-05 DIAGNOSIS — N3090 Cystitis, unspecified without hematuria: Secondary | ICD-10-CM | POA: Insufficient documentation

## 2023-06-05 DIAGNOSIS — R300 Dysuria: Secondary | ICD-10-CM

## 2023-06-05 LAB — POC URINALSYSI DIPSTICK (AUTOMATED)
Bilirubin, UA: POSITIVE
Blood, UA: POSITIVE
Glucose, UA: NEGATIVE
Ketones, UA: NEGATIVE
Nitrite, UA: POSITIVE
Protein, UA: NEGATIVE
Spec Grav, UA: 1.01 (ref 1.010–1.025)
Urobilinogen, UA: 2 U/dL — AB
pH, UA: 6 (ref 5.0–8.0)

## 2023-06-05 MED ORDER — NITROFURANTOIN MONOHYD MACRO 100 MG PO CAPS: 100.0000 mg | ORAL_CAPSULE | Freq: Two times a day (BID) | ORAL | 0 refills | Status: DC

## 2023-06-05 MED ORDER — ZOLPIDEM TARTRATE ER 12.5 MG PO TBCR: 12.5000 mg | EXTENDED_RELEASE_TABLET | Freq: Every evening | ORAL | 1 refills | Status: AC | PRN

## 2023-06-05 NOTE — Telephone Encounter (Signed)
 Thanks, I sent it  Thanks for seeing her

## 2023-06-05 NOTE — Assessment & Plan Note (Signed)
 UA positive in office. Will treat with macrobid given exam and recent abx usage. Pending urine culture

## 2023-06-05 NOTE — Progress Notes (Signed)
   Acute Office Visit  Subjective:     Patient ID: Elizabeth Sheppard, female    DOB: May 11, 1970, 53 y.o.   MRN: [id number]  Chief Complaint  Patient presents with   Urinary Tract Infection    Pt complains of having UTI that started at 3AM this morning.  Pt states of having mastectomy and was on multiple rounds of antibiotics. Pt states of having a breast exchange surgery to place implant on June 11th. States of need for infection to be cleared before surgery.    Medication Refill    Ambien .      Patient is in today for dysuria with a history of surgical meopause. HRT, and renal cyst   Started this am at 3am. States that she has the urge to pee. States some dysuria.  States that she has been on bactrim , cipro , and bactrim  DS along with parental antibiotics She has been taking AZO and that has helped.  She is working on increasing her fluid intake  Review of Systems  Constitutional:  Negative for chills and fever.  Gastrointestinal:  Negative for abdominal pain, nausea and vomiting.  Genitourinary:  Positive for dysuria. Negative for hematuria.       Urge to use the bathroom   Musculoskeletal:  Negative for back pain.        Objective:    BP 98/82   Pulse 66   Temp 98 F (36.7 C) (Oral)   Ht 5\' 5"  (1.651 m)   Wt 154 lb 6.4 oz (70 kg)   LMP 07/15/2020 Comment: Pregnancy test negative  SpO2 96%   BMI 25.69 kg/m    Physical Exam Vitals and nursing note reviewed.  Constitutional:      Appearance: Normal appearance.  Cardiovascular:     Rate and Rhythm: Normal rate and regular rhythm.     Heart sounds: Normal heart sounds.  Pulmonary:     Effort: Pulmonary effort is normal.     Breath sounds: Normal breath sounds.  Abdominal:     General: Bowel sounds are normal. There is no distension.     Palpations: There is no mass.     Tenderness: There is no abdominal tenderness. There is no right CVA tenderness or left CVA tenderness.     Hernia: No hernia is present.   Neurological:     Mental Status: She is alert.     No results found for any visits on 06/05/23.      Assessment & Plan:   Problem List Items Addressed This Visit       Genitourinary   Cystitis - Primary   UA positive in office. Will treat with macrobid given exam and recent abx usage. Pending urine culture       Relevant Medications   nitrofurantoin, macrocrystal-monohydrate, (MACROBID) 100 MG capsule   Other Relevant Orders   Urine Culture     Other   Dysuria   UA in office        Meds ordered this encounter  Medications   nitrofurantoin, macrocrystal-monohydrate, (MACROBID) 100 MG capsule    Sig: Take 1 capsule (100 mg total) by mouth 2 (two) times daily.    Dispense:  10 capsule    Refill:  0    Supervising Provider:   Deri Fleet A [[id number]    Return if symptoms worsen or fail to improve.  Margarie Shay, NP

## 2023-06-05 NOTE — Assessment & Plan Note (Signed)
 UA in office

## 2023-06-05 NOTE — Addendum Note (Signed)
 Addended by: Doretha Ganja on: 06/05/2023 01:02 PM   Modules accepted: Orders

## 2023-06-05 NOTE — Telephone Encounter (Signed)
 Saw patient for an acute visit today and requested refill on Ambien . States that he other script expired

## 2023-06-05 NOTE — Patient Instructions (Signed)
 Nice to see you today I will be I touch with the urine culture once I have it Use the AZO for no more than 2 more days Follow up if you do not improve

## 2023-06-07 ENCOUNTER — Ambulatory Visit (INDEPENDENT_AMBULATORY_CARE_PROVIDER_SITE_OTHER): Admitting: Dermatology

## 2023-06-07 ENCOUNTER — Encounter: Payer: Self-pay | Admitting: Dermatology

## 2023-06-07 ENCOUNTER — Telehealth: Payer: Self-pay | Admitting: *Deleted

## 2023-06-07 DIAGNOSIS — S20461A Insect bite (nonvenomous) of right back wall of thorax, initial encounter: Secondary | ICD-10-CM

## 2023-06-07 DIAGNOSIS — Z1283 Encounter for screening for malignant neoplasm of skin: Secondary | ICD-10-CM

## 2023-06-07 DIAGNOSIS — S30860A Insect bite (nonvenomous) of lower back and pelvis, initial encounter: Secondary | ICD-10-CM

## 2023-06-07 DIAGNOSIS — D1801 Hemangioma of skin and subcutaneous tissue: Secondary | ICD-10-CM

## 2023-06-07 DIAGNOSIS — D2262 Melanocytic nevi of left upper limb, including shoulder: Secondary | ICD-10-CM | POA: Diagnosis not present

## 2023-06-07 DIAGNOSIS — Z86018 Personal history of other benign neoplasm: Secondary | ICD-10-CM

## 2023-06-07 DIAGNOSIS — D492 Neoplasm of unspecified behavior of bone, soft tissue, and skin: Secondary | ICD-10-CM | POA: Diagnosis not present

## 2023-06-07 DIAGNOSIS — W57XXXA Bitten or stung by nonvenomous insect and other nonvenomous arthropods, initial encounter: Secondary | ICD-10-CM | POA: Diagnosis not present

## 2023-06-07 DIAGNOSIS — Z7189 Other specified counseling: Secondary | ICD-10-CM

## 2023-06-07 DIAGNOSIS — L820 Inflamed seborrheic keratosis: Secondary | ICD-10-CM

## 2023-06-07 DIAGNOSIS — L578 Other skin changes due to chronic exposure to nonionizing radiation: Secondary | ICD-10-CM

## 2023-06-07 DIAGNOSIS — D2362 Other benign neoplasm of skin of left upper limb, including shoulder: Secondary | ICD-10-CM

## 2023-06-07 DIAGNOSIS — Z79899 Other long term (current) drug therapy: Secondary | ICD-10-CM

## 2023-06-07 DIAGNOSIS — L814 Other melanin hyperpigmentation: Secondary | ICD-10-CM

## 2023-06-07 DIAGNOSIS — L821 Other seborrheic keratosis: Secondary | ICD-10-CM

## 2023-06-07 DIAGNOSIS — W908XXA Exposure to other nonionizing radiation, initial encounter: Secondary | ICD-10-CM

## 2023-06-07 DIAGNOSIS — D229 Melanocytic nevi, unspecified: Secondary | ICD-10-CM

## 2023-06-07 LAB — URINE CULTURE
MICRO NUMBER:: 16483003
Result:: NO GROWTH
SPECIMEN QUALITY:: ADEQUATE

## 2023-06-07 NOTE — Patient Instructions (Addendum)

## 2023-06-07 NOTE — Progress Notes (Signed)
 Follow-Up Visit   Subjective  Elizabeth Sheppard is a 53 y.o. female who presents for the following: Skin Cancer Screening and Full Body Skin Exam, hx of Dysplastic nevus   The patient presents for Total-Body Skin Exam (TBSE) for skin cancer screening and mole check. The patient has spots, moles and lesions to be evaluated, some may be new or changing and the patient may have concern these could be cancer.  The following portions of the chart were reviewed this encounter and updated as appropriate: medications, allergies, medical history  Review of Systems:  No other skin or systemic complaints except as noted in HPI or Assessment and Plan.  Objective  Well appearing patient in no apparent distress; mood and affect are within normal limits.  A full examination was performed including scalp, head, eyes, ears, nose, lips, neck, chest, axillae, abdomen, back, buttocks, bilateral upper extremities, bilateral lower extremities, hands, feet, fingers, toes, fingernails, and toenails. All findings within normal limits unless otherwise noted below.   Relevant physical exam findings are noted in the Assessment and Plan.  right superior forehead x 1, right dorsum hand x 1, left ant thigh x 1, right medial pretibial x 1 (4) Stuck-on, waxy, tan-brown papules and plaques -- Discussed benign etiology and prognosis.  left upper arm posterior 0.3 cm gray papule   Right Upper Back Non attacked appearing tick      Assessment & Plan   SKIN CANCER SCREENING PERFORMED TODAY.  ACTINIC DAMAGE - Chronic condition, secondary to cumulative UV/sun exposure - diffuse scaly erythematous macules with underlying dyspigmentation - Recommend daily broad spectrum sunscreen SPF 30+ to sun-exposed areas, reapply every 2 hours as needed.  - Staying in the shade or wearing long sleeves, sun glasses (UVA+UVB protection) and wide brim hats (4-inch brim around the entire circumference of the hat) are also  recommended for sun protection.  - Call for new or changing lesions.  LENTIGINES, SEBORRHEIC KERATOSES, HEMANGIOMAS - Benign normal skin lesions - Benign-appearing - Call for any changes  MELANOCYTIC NEVI - Tan-brown and/or pink-flesh-colored symmetric macules and papules - Benign appearing on exam today - Observation - Call clinic for new or changing moles - Recommend daily use of broad spectrum spf 30+ sunscreen to sun-exposed areas.   HISTORY OF DYSPLASTIC NEVUS Left upper flank No evidence of recurrence today Recommend regular full body skin exams Recommend daily broad spectrum sunscreen SPF 30+ to sun-exposed areas, reapply every 2 hours as needed.  Call if any new or changing lesions are noted between office visits    INFLAMED SEBORRHEIC KERATOSIS (4) right superior forehead x 1, right dorsum hand x 1, left ant thigh x 1, right medial pretibial x 1 (4) Symptomatic, irritating, patient would like treated.  Destruction of lesion - right superior forehead x 1, right dorsum hand x 1, left ant thigh x 1, right medial pretibial x 1 (4) Complexity: simple   Destruction method: cryotherapy   Informed consent: discussed and consent obtained   Timeout:  patient name, date of birth, surgical site, and procedure verified Lesion destroyed using liquid nitrogen: Yes   Region frozen until ice ball extended beyond lesion: Yes   Outcome: patient tolerated procedure well with no complications   Post-procedure details: wound care instructions given   Additional details:  Prior to procedure, discussed risks of blister formation, small wound, skin dyspigmentation, or rare scar following cryotherapy. Recommend Vaseline ointment to treated areas while healing.  NEOPLASM OF SKIN left upper arm posterior Epidermal /  dermal shaving  Lesion diameter (cm):  0.3 Informed consent: discussed and consent obtained   Timeout: patient name, date of birth, surgical site, and procedure verified    Procedure prep:  Patient was prepped and draped in usual sterile fashion Prep type:  Isopropyl alcohol Anesthesia: the lesion was anesthetized in a standard fashion   Anesthetic:  1% lidocaine  w/ epinephrine 1-100,000 buffered w/ 8.4% NaHCO3 Hemostasis achieved with: pressure, aluminum chloride and electrodesiccation   Outcome: patient tolerated procedure well   Post-procedure details: sterile dressing applied and wound care instructions given   Dressing type: bandage and petrolatum   Specimen 1 - Surgical pathology Differential Diagnosis: R/O Blue nevus   Check Margins: No TICK BITE OF BACK, INITIAL ENCOUNTER Right Upper Back Foreign body tick removal   If any fevers, headaches patient should start Doxycycline tablets, patient already has Doxycycline at home she will take 2 tablets today and repeat in 12 hours  Destruction of lesion - Right Upper Back Complexity: simple   Informed consent: discussed and consent obtained   Outcome: patient tolerated procedure well with no complications   Post-procedure details: wound care instructions given     Return in about 1 year (around 06/06/2024) for TBSE, hx of Dysplastic nevus .  IClara Crisp, CMA, am acting as scribe for Celine Collard, MD .   Documentation: I have reviewed the above documentation for accuracy and completeness, and I agree with the above.  Celine Collard, MD

## 2023-06-08 ENCOUNTER — Other Ambulatory Visit (HOSPITAL_COMMUNITY): Payer: Self-pay

## 2023-06-08 ENCOUNTER — Telehealth: Payer: Self-pay

## 2023-06-08 ENCOUNTER — Ambulatory Visit: Payer: Self-pay | Admitting: Nurse Practitioner

## 2023-06-08 NOTE — Telephone Encounter (Signed)
 Pharmacy Patient Advocate Encounter   Received notification from Pt Calls Messages that prior authorization for Zolpidem  12.5 is required/requested.   Insurance verification completed.   The patient is insured through [employer] .   Per test claim: Refill too soon. PA is not needed at this time. Medication was filled 12/06/22. Next eligible fill date is 12/06/23. Patient has plan limit of 30 tabs per 365 days

## 2023-06-12 DIAGNOSIS — Z01818 Encounter for other preprocedural examination: Secondary | ICD-10-CM | POA: Diagnosis not present

## 2023-06-12 DIAGNOSIS — Z1501 Genetic susceptibility to malignant neoplasm of breast: Secondary | ICD-10-CM | POA: Diagnosis not present

## 2023-06-12 DIAGNOSIS — Z1509 Genetic susceptibility to other malignant neoplasm: Secondary | ICD-10-CM | POA: Diagnosis not present

## 2023-06-12 DIAGNOSIS — Z9013 Acquired absence of bilateral breasts and nipples: Secondary | ICD-10-CM | POA: Diagnosis not present

## 2023-06-14 LAB — SURGICAL PATHOLOGY

## 2023-06-18 ENCOUNTER — Ambulatory Visit: Payer: Self-pay | Admitting: Dermatology

## 2023-06-18 NOTE — Telephone Encounter (Signed)
 See will need another office visit

## 2023-06-18 NOTE — Telephone Encounter (Signed)
 Spoke to pt, scheduled f/u on 6/4

## 2023-06-19 NOTE — Telephone Encounter (Signed)
 Patient informed of pathology results

## 2023-06-19 NOTE — Telephone Encounter (Signed)
-----   Message from Celine Collard sent at 06/18/2023  7:31 PM EDT ----- FINAL DIAGNOSIS        1. Skin, left upper arm posterior :       BLUE NEVUS, BASE INVOLVED   Benign "blue nevus" May persist or recur No further treatment needed

## 2023-06-20 ENCOUNTER — Ambulatory Visit (INDEPENDENT_AMBULATORY_CARE_PROVIDER_SITE_OTHER): Admitting: Nurse Practitioner

## 2023-06-20 VITALS — BP 110/80 | HR 71 | Temp 98.2°F | Ht 65.0 in | Wt 154.4 lb

## 2023-06-20 DIAGNOSIS — R300 Dysuria: Secondary | ICD-10-CM

## 2023-06-20 DIAGNOSIS — N3090 Cystitis, unspecified without hematuria: Secondary | ICD-10-CM | POA: Diagnosis not present

## 2023-06-20 LAB — POCT URINALYSIS DIPSTICK
Bilirubin, UA: NEGATIVE
Blood, UA: POSITIVE
Glucose, UA: NEGATIVE
Ketones, UA: NEGATIVE
Nitrite, UA: NEGATIVE
Protein, UA: NEGATIVE
Spec Grav, UA: 1.01 (ref 1.010–1.025)
Urobilinogen, UA: 0.2 U/dL
pH, UA: 6 (ref 5.0–8.0)

## 2023-06-20 MED ORDER — NITROFURANTOIN MONOHYD MACRO 100 MG PO CAPS: 100.0000 mg | ORAL_CAPSULE | Freq: Two times a day (BID) | ORAL | 0 refills | Status: DC

## 2023-06-20 NOTE — Progress Notes (Signed)
 Acute Office Visit  Subjective:     Patient ID: Elizabeth Sheppard, female    DOB: 1970/12/21, 53 y.o.   MRN: [id number]  Chief Complaint  Patient presents with   Follow-up    UTI symptoms. Pt complains of frequent urination that has come back. Pt states she has tried [place] and increasing water.     HPI Patient is in today for urinary symptoms with a history of cystitis, surgical menopause, HRT, anxiety.  Patient was seen by me on 06/05/2023 for cystitis.  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  At that juncture her culture came back inconclusive and she was placed on Macrobid  1 capsule twice daily for 5 days.  Patient is here for UTI symptoms. Symptoms started on Sunday. States that she had intercourse with her husband on Thursday and felt irratatied. States that she felt dysuria and urinary frequncy and incomplete emptying She does had a urogyn Dr. Amon Kand and is at [facility]   Review of Systems  Constitutional:  Negative for chills and fever.  Respiratory:  Negative for shortness of breath.   Cardiovascular:  Negative for chest pain.  Gastrointestinal:  Negative for abdominal pain, diarrhea, nausea and vomiting.  Genitourinary:  Positive for dysuria and frequency.        Objective:    BP 110/80   Pulse 71   Temp 98.2 F (36.8 C) (Oral)   Ht 5\' 5"  (1.651 m)   Wt 154 lb 6.4 oz (70 kg)   LMP 07/15/2020 Comment: Pregnancy test negative  SpO2 98%   BMI 25.69 kg/m    Physical Exam Vitals and nursing note reviewed.  Constitutional:      Appearance: Normal appearance.  Cardiovascular:     Rate and Rhythm: Normal rate and regular rhythm.     Heart sounds: Normal heart sounds.  Pulmonary:     Effort: Pulmonary effort is normal.     Breath sounds: Normal breath sounds.  Abdominal:     General: Bowel sounds are normal. There is no distension.      Palpations: There is no mass.     Tenderness: There is no abdominal tenderness. There is no right CVA tenderness or left CVA tenderness.  Neurological:     Mental Status: She is alert.     Results for orders placed or performed in visit on 06/20/23  POCT urinalysis dipstick  Result Value Ref Range   Color, UA Orange yellow    Clarity, UA clear    Glucose, UA Negative Negative   Bilirubin, UA neg    Ketones, UA neg    Spec Grav, UA 1.010 1.010 - 1.025   Blood, UA pos    pH, UA 6.0 5.0 - 8.0   Protein, UA Negative Negative   Urobilinogen, UA 0.2 0.2 or 1.0 E.U./dL   Nitrite, UA neg    Leukocytes, UA Small (1+) (A) Negative   Appearance     Odor          Assessment & Plan:   Problem List Items Addressed This Visit       Genitourinary   Cystitis - Primary   Will re treat with macrobid  100mg  BID for 5 days. She does have a urogyn she can follow with at [facility]. She will abstain for sexual intercourse until she has her surgery   Pending urine culture. Continue increased fluids  Relevant Medications   nitrofurantoin , macrocrystal-monohydrate, (MACROBID ) 100 MG capsule   Other Relevant Orders   Urine Culture     Other   Dysuria   UA in office       Relevant Orders   POCT urinalysis dipstick (Completed)    Meds ordered this encounter  Medications   nitrofurantoin , macrocrystal-monohydrate, (MACROBID ) 100 MG capsule    Sig: Take 1 capsule (100 mg total) by mouth 2 (two) times daily.    Dispense:  10 capsule    Refill:  0    Supervising Provider:   Deri Fleet A [[id number]]    Return if symptoms worsen or fail to improve.  Margarie Shay, NP

## 2023-06-20 NOTE — Assessment & Plan Note (Signed)
 UA in office

## 2023-06-20 NOTE — Assessment & Plan Note (Addendum)
 Will re treat with macrobid  100mg  BID for 5 days. She does have a urogyn she can follow with at [facility]. She will abstain for sexual intercourse until she has her surgery   Pending urine culture. Continue increased fluids

## 2023-06-20 NOTE — Patient Instructions (Signed)
 Nice to see you today I have sent antibiotics to the pharmacy  Follow up as needed  You can use a good rx for the Ambien 

## 2023-06-21 LAB — URINE CULTURE
MICRO NUMBER:: 16538249
Result:: NO GROWTH
SPECIMEN QUALITY:: ADEQUATE

## 2023-06-25 ENCOUNTER — Ambulatory Visit: Payer: Self-pay | Admitting: Nurse Practitioner

## 2023-06-27 DIAGNOSIS — N650 Deformity of reconstructed breast: Secondary | ICD-10-CM | POA: Diagnosis not present

## 2023-06-27 DIAGNOSIS — Z1509 Genetic susceptibility to other malignant neoplasm: Secondary | ICD-10-CM | POA: Diagnosis not present

## 2023-06-27 DIAGNOSIS — Z421 Encounter for breast reconstruction following mastectomy: Secondary | ICD-10-CM | POA: Diagnosis not present

## 2023-06-27 DIAGNOSIS — M96843 Postprocedural seroma of a musculoskeletal structure following other procedure: Secondary | ICD-10-CM | POA: Diagnosis not present

## 2023-06-27 DIAGNOSIS — Z1501 Genetic susceptibility to malignant neoplasm of breast: Secondary | ICD-10-CM | POA: Diagnosis not present

## 2023-06-27 DIAGNOSIS — Z45811 Encounter for adjustment or removal of right breast implant: Secondary | ICD-10-CM | POA: Diagnosis not present

## 2023-06-27 DIAGNOSIS — Z45812 Encounter for adjustment or removal of left breast implant: Secondary | ICD-10-CM | POA: Diagnosis not present

## 2023-06-27 DIAGNOSIS — Z79899 Other long term (current) drug therapy: Secondary | ICD-10-CM | POA: Diagnosis not present

## 2023-06-27 DIAGNOSIS — L7634 Postprocedural seroma of skin and subcutaneous tissue following other procedure: Secondary | ICD-10-CM | POA: Diagnosis not present

## 2023-06-27 DIAGNOSIS — Z79890 Hormone replacement therapy (postmenopausal): Secondary | ICD-10-CM | POA: Diagnosis not present

## 2023-06-27 DIAGNOSIS — Z9013 Acquired absence of bilateral breasts and nipples: Secondary | ICD-10-CM | POA: Diagnosis not present

## 2023-06-28 ENCOUNTER — Encounter: Payer: Federal, State, Local not specified - PPO | Admitting: Dermatology

## 2023-07-03 DIAGNOSIS — Z1509 Genetic susceptibility to other malignant neoplasm: Secondary | ICD-10-CM | POA: Diagnosis not present

## 2023-07-03 DIAGNOSIS — Z9013 Acquired absence of bilateral breasts and nipples: Secondary | ICD-10-CM | POA: Diagnosis not present

## 2023-07-03 DIAGNOSIS — Z9889 Other specified postprocedural states: Secondary | ICD-10-CM | POA: Diagnosis not present

## 2023-07-03 DIAGNOSIS — Z1501 Genetic susceptibility to malignant neoplasm of breast: Secondary | ICD-10-CM | POA: Diagnosis not present

## 2023-07-12 ENCOUNTER — Encounter: Admitting: Dermatology

## 2023-07-16 DIAGNOSIS — Z4889 Encounter for other specified surgical aftercare: Secondary | ICD-10-CM | POA: Diagnosis not present

## 2023-07-16 DIAGNOSIS — Z9013 Acquired absence of bilateral breasts and nipples: Secondary | ICD-10-CM | POA: Diagnosis not present

## 2023-07-16 DIAGNOSIS — Z1501 Genetic susceptibility to malignant neoplasm of breast: Secondary | ICD-10-CM | POA: Diagnosis not present

## 2023-07-16 DIAGNOSIS — Z9889 Other specified postprocedural states: Secondary | ICD-10-CM | POA: Diagnosis not present

## 2023-07-16 DIAGNOSIS — Z1509 Genetic susceptibility to other malignant neoplasm: Secondary | ICD-10-CM | POA: Diagnosis not present

## 2023-08-15 DIAGNOSIS — R6882 Decreased libido: Secondary | ICD-10-CM | POA: Diagnosis not present

## 2023-08-15 DIAGNOSIS — R399 Unspecified symptoms and signs involving the genitourinary system: Secondary | ICD-10-CM | POA: Diagnosis not present

## 2023-08-15 DIAGNOSIS — Z780 Asymptomatic menopausal state: Secondary | ICD-10-CM | POA: Diagnosis not present

## 2023-08-15 DIAGNOSIS — N390 Urinary tract infection, site not specified: Secondary | ICD-10-CM | POA: Diagnosis not present

## 2023-08-28 DIAGNOSIS — Z1501 Genetic susceptibility to malignant neoplasm of breast: Secondary | ICD-10-CM | POA: Diagnosis not present

## 2023-08-28 DIAGNOSIS — Z1509 Genetic susceptibility to other malignant neoplasm: Secondary | ICD-10-CM | POA: Diagnosis not present

## 2023-08-28 DIAGNOSIS — Z9013 Acquired absence of bilateral breasts and nipples: Secondary | ICD-10-CM | POA: Diagnosis not present

## 2023-08-28 DIAGNOSIS — Z9889 Other specified postprocedural states: Secondary | ICD-10-CM | POA: Diagnosis not present

## 2023-08-29 DIAGNOSIS — Z1501 Genetic susceptibility to malignant neoplasm of breast: Secondary | ICD-10-CM | POA: Diagnosis not present

## 2023-08-29 DIAGNOSIS — Z1289 Encounter for screening for malignant neoplasm of other sites: Secondary | ICD-10-CM | POA: Diagnosis not present

## 2023-08-29 DIAGNOSIS — Z0489 Encounter for examination and observation for other specified reasons: Secondary | ICD-10-CM | POA: Diagnosis not present

## 2023-08-29 DIAGNOSIS — Z79899 Other long term (current) drug therapy: Secondary | ICD-10-CM | POA: Diagnosis not present

## 2023-08-29 DIAGNOSIS — Z87440 Personal history of urinary (tract) infections: Secondary | ICD-10-CM | POA: Diagnosis not present

## 2023-08-29 DIAGNOSIS — K8689 Other specified diseases of pancreas: Secondary | ICD-10-CM | POA: Diagnosis not present

## 2023-08-29 DIAGNOSIS — Z1509 Genetic susceptibility to other malignant neoplasm: Secondary | ICD-10-CM | POA: Diagnosis not present

## 2023-11-08 DIAGNOSIS — R6882 Decreased libido: Secondary | ICD-10-CM | POA: Diagnosis not present

## 2023-11-08 DIAGNOSIS — F419 Anxiety disorder, unspecified: Secondary | ICD-10-CM | POA: Diagnosis not present

## 2023-11-08 DIAGNOSIS — N951 Menopausal and female climacteric states: Secondary | ICD-10-CM | POA: Diagnosis not present

## 2023-11-08 DIAGNOSIS — F19981 Other psychoactive substance use, unspecified with psychoactive substance-induced sexual dysfunction: Secondary | ICD-10-CM | POA: Diagnosis not present

## 2023-11-12 ENCOUNTER — Ambulatory Visit

## 2023-11-12 DIAGNOSIS — L508 Other urticaria: Secondary | ICD-10-CM

## 2023-11-12 MED ORDER — TACROLIMUS 0.1 % EX OINT: TOPICAL_OINTMENT | CUTANEOUS | 5 refills | Status: AC

## 2023-11-12 MED ORDER — TRIAMCINOLONE ACETONIDE 0.1 % EX OINT: TOPICAL_OINTMENT | CUTANEOUS | 5 refills | Status: AC

## 2023-11-12 NOTE — Patient Instructions (Addendum)
 - Start cetirizine (Zyrtec) 10 mg, one pill daily for one week. - If symptoms persist, increase to two pills daily for one week. - If symptoms persist, increase to three pills daily, splitting the dose (one in the morning, two at night). - If symptoms persist, increase to four pills daily, splitting the dose (two in the morning, two at night).  Steroid Use  We prescribed you a topical steroid at today's visit.   General application instructions: -Apply this to any affected skin areas, twice (2 times) daily, for two (2) weeks -If the areas are better, you can stop -Re-start the topical steroid if the areas come back, or flare -If the areas don't get better after two weeks, we sometimes recommend taking a break for one (1) week, before restarting for another two (2) weeks. Repeat as needed  The most common side effects of topical steroid medications include changes in skin pigment and thinning of the skin. If the steroid is only applied to affected areas of the skin, these effects rarely occur unless the steroid is used for a very long time (years without stopping).   If we prescribed you a strong steroid, please avoid applying to face, groin, or neck, unless we tell you otherwise. We will include more detail in your prescription instructions.   Due to recent changes in healthcare laws, you may see results of your pathology and/or laboratory studies on MyChart before the doctors have had a chance to review them. We understand that in some cases there may be results that are confusing or concerning to you. Please understand that not all results are received at the same time and often the doctors may need to interpret multiple results in order to provide you with the best plan of care or course of treatment. Therefore, we ask that you please give us  2 business days to thoroughly review all your results before contacting the office for clarification. Should we see a critical lab result, you will  be contacted sooner.   If You Need Anything After Your Visit  If you have any questions or concerns for your doctor, please call our main line at [phone] and press option 4 to reach your doctor's medical assistant. If no one answers, please leave a voicemail as directed and we will return your call as soon as possible. Messages left after 4 pm will be answered the following business day.   You may also send us  a message via MyChart. We typically respond to MyChart messages within 1-2 business days.  For prescription refills, please ask your pharmacy to contact our office. Our fax number is [phone].  If you have an urgent issue when the clinic is closed that cannot wait until the next business day, you can page your doctor at the number below.    Please note that while we do our best to be available for urgent issues outside of office hours, we are not available 24/7.   If you have an urgent issue and are unable to reach us , you may choose to seek medical care at your doctor's office, retail clinic, urgent care center, or emergency room.  If you have a medical emergency, please immediately call 911 or go to the emergency department.  Pager Numbers  - Dr. Hester: [phone]  - Dr. Jackquline: [phone]  - Dr. Claudene: [phone]   - Dr. Raymund: [phone]  In the event of inclement weather, please call our main line at [phone] for an update on the status  of any delays or closures.  Dermatology Medication Tips: Please keep the boxes that topical medications come in in order to help keep track of the instructions about where and how to use these. Pharmacies typically print the medication instructions only on the boxes and not directly on the medication tubes.   If your medication is too expensive, please contact our office at [phone] option 4 or send us  a message through MyChart.   We are unable to tell what your co-pay for medications will be in advance as  this is different depending on your insurance coverage. However, we may be able to find a substitute medication at lower cost or fill out paperwork to get insurance to cover a needed medication.   If a prior authorization is required to get your medication covered by your insurance company, please allow us  1-2 business days to complete this process.  Drug prices often vary depending on where the prescription is filled and some pharmacies may offer cheaper prices.  The website www.goodrx.com contains coupons for medications through different pharmacies. The prices here do not account for what the cost may be with help from insurance (it may be cheaper with your insurance), but the website can give you the price if you did not use any insurance.  - You can print the associated coupon and take it with your prescription to the pharmacy.  - You may also stop by our office during regular business hours and pick up a GoodRx coupon card.  - If you need your prescription sent electronically to a different pharmacy, notify our office through [facility] or by phone at [phone] option 4.     Si Usted Necesita Algo Despus de Su Visita  Tambin puede enviarnos un mensaje a travs de Clinical Cytogeneticist. Por lo general respondemos a los mensajes de MyChart en el transcurso de 1 a 2 das hbiles.  Para renovar recetas, por favor pida a su farmacia que se ponga en contacto con nuestra oficina. [place] de fax es [place] [phone].  Si tiene un asunto urgente cuando la clnica est cerrada y que no puede esperar hasta el siguiente da hbil, puede llamar/localizar a su doctor(a) al nmero que aparece a continuacin.   Por favor, tenga en cuenta que aunque hacemos todo lo posible para estar disponibles para asuntos urgentes fuera del horario de [place], no estamos disponibles las 24 horas del da, los 7 [address]  [phone] de la [place].   Si tiene un problema urgente y no puede comunicarse con nosotros, puede optar por  buscar atencin mdica  en el consultorio de su doctor(a), en una clnica privada, en un centro de atencin urgente o en una sala de emergencias.  Si tiene engineer, drilling, por favor llame inmediatamente al 911 o vaya a la sala de emergencias.  Nmeros de bper  - Dr. Hester: [phone]  - Dra. Jackquline: [phone]  - Dr. Claudene: [phone]  - Dra. Kitts: [phone]  En caso de inclemencias del [place], por favor llame a nuestra lnea principal al [phone] para una actualizacin sobre el estado de cualquier retraso o cierre.  Consejos para la medicacin en dermatologa: Por favor, guarde las cajas en las que vienen los medicamentos de uso tpico para ayudarle a seguir las instrucciones sobre dnde y cmo usarlos. Las farmacias generalmente imprimen las instrucciones del medicamento slo en las cajas y no directamente en los tubos del [place].   Si su medicamento es pepco holdings, por favor, pngase en contacto con landry oficina  llamando al [phone] y presione la opcin 4 o envenos un mensaje a travs de Clinical Cytogeneticist.   No podemos decirle cul ser su copago por los medicamentos por adelantado ya que esto es diferente dependiendo de la cobertura de su seguro. Sin embargo, es posible que podamos encontrar un medicamento sustituto a audiological scientist un formulario para que el seguro cubra el medicamento que se considera necesario.   Si se requiere una autorizacin previa para que su compaa de seguros cubra su medicamento, por favor permtanos de 1 a 2 das hbiles para completar este proceso.  Los precios de los medicamentos varan con frecuencia dependiendo del environmental consultant de dnde se surte la receta y alguna farmacias pueden ofrecer precios ms baratos.  El sitio web www.goodrx.com tiene cupones para medicamentos de health and safety inspector. Los precios aqu no tienen en cuenta lo que podra costar con la ayuda del seguro (puede ser ms barato con su seguro), pero el sitio web  puede darle el precio si no utiliz tourist information centre manager.  - Puede imprimir el cupn correspondiente y llevarlo con su receta a la farmacia.  - Tambin puede pasar por nuestra oficina durante el horario de atencin regular y education officer, museum una tarjeta de cupones de GoodRx.  - Si necesita que su receta se enve electrnicamente a una farmacia diferente, informe a nuestra oficina a travs de MyChart de [place] o por telfono llamando al [phone] y presione la opcin 4.

## 2023-11-12 NOTE — Progress Notes (Signed)
    Subjective   Elizabeth Sheppard is a 53 y.o. female who presents for the following: Allergic reaction. Patient is established patient   Today patient reports: Rash itchy all over body has been worsening over the past 2 months. Patient denied environmental and dietary changes. She did start Wellbutrin on Friday but symptoms did not change after starting medication. Patient thinks the symptoms started after she started getting her nails done; she states that the acetone makes her hands dry and itchy. Patient has taken Hydroxyzine for the past two days, she has also used OTC hydrocortisone  cream Area of concern on the left lower extremity.   Review of Systems:    No other skin or systemic complaints except as noted in HPI or Assessment and Plan.  The following portions of the chart were reviewed this encounter and updated as appropriate: medications, allergies, medical history  Relevant Medical History:  n/a   Objective  Well appearing patient in no apparent distress; mood and affect are within normal limits. 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 Condition is symptomatic and bothersome to patient. Patient is flaring and not currently at treatment goal.  - Explained the process of cholinergic urticaria that can be associated with overheating, overstimulation - Initiating cetirizine 10 mg (OTC) 1-4 pills daily as tolerated.   - Advised can use up to 4x the recommended dose of antihistamines - Stressed the importance of taking  these pills even when not itching.  - Encouraged avoidance of exacerbating factors (e.g. Heat) - Discussed consideration of Allergy referral, omalizumab, dupixent, rhapsido in the future  start triamcinolone  ointment 0.1% twice daily to affected areas of skin Discussed side effect of potent topical steroids including atrophy, dyspigmentation, striae, telangectasia, folliculitis, loss of skin pigment, hair growth, tachyphylaxis, risk of systemic absorption with missuse. Start tacrolimus ointment 0.1% twice daily on affected areas of the face Educated about black box warning (and also that recent studies show no increased malignancy risk) and common adverse effects such as burning with application (advised to cool in fridge and only apply to bone-dry skin). Given location of eruption, unsafe to use daily topical CS to area. Patient requires topical calcineurin inhibitor given high risk of dyspigmentation and atrophy from topical CS use in sensitive area. - Discussed need for oral or injectable medication if not well controlled with topicals.  Chronic and persistent condition with duration or expected duration over one year. Condition is symptomatic and bothersome to patient. Patient is flaring and not currently at treatment goal.   Procedures, orders, diagnosis for this visit:    There are no diagnoses linked to this encounter.  Return to clinic: Return 6-8 weeks, for rash.  I, Emerick Ege, CMA am acting as scribe for Lauraine JAYSON Kanaris, MD   Documentation: I have reviewed the above documentation for accuracy and completeness, and I agree with the above.  Lauraine JAYSON Kanaris, MD

## 2023-11-23 DIAGNOSIS — Z1509 Genetic susceptibility to other malignant neoplasm: Secondary | ICD-10-CM | POA: Diagnosis not present

## 2023-11-23 DIAGNOSIS — Z1589 Genetic susceptibility to other disease: Secondary | ICD-10-CM | POA: Diagnosis not present

## 2023-11-23 DIAGNOSIS — Z9013 Acquired absence of bilateral breasts and nipples: Secondary | ICD-10-CM | POA: Diagnosis not present

## 2023-11-23 DIAGNOSIS — Z1501 Genetic susceptibility to malignant neoplasm of breast: Secondary | ICD-10-CM | POA: Diagnosis not present

## 2023-11-23 NOTE — Progress Notes (Signed)
 Division of Surgical Oncology--Breast Surgery Breast Clinic--Return Visit  DATE OF SERVICE: 11/23/2023  CC: This is a 53 y.o. female with a BRCA2 mutation, s/p bilateral nipple sparing mastectomies in 09-May-2023.  INTERVAL HISTORY: Elizabeth Sheppard presents to clinic for routine high risk breast follow up. Currently, she is doing well and has no new breast concerns. She denies nipple discharge, skin changes, or palpable masses. Since last being seen she underwent revision w/ Dr. Otelia. She is pleased with her cosmetic outcome and has scheduled follow-up w/ Dr. Orlando team in February to discuss further revisions including possible fat grafting and management of slight asymmetry in size. Patient is not accompanied today.  BRCA2 & BREAST SUMMARY: 07/2021: Genetic testing - BRCA2 mutation identified 12/29/2022: s/p bilateral superomedial pedicle mastopexy   03/27/2023: s/p bilateral nipple-sparing mastectomy and immediate placement of TE and ADM (Drs. Plichta & Orlando) c/b left breast cellulitis requiring washout and TE replacement on 05/02/23  06/27/2023:  s/p bilateral TE removal and permanent implant placement and lipo/FG to the bilateral breasts     Genetic Testing:   Date 07/2021   Location,   [facility] [place], [place]   Result [name]  tested positive for a BRCA2 mutation,[id number] ([id number]). She was told that her AIP, ALK, APC, ATM, AXIN2 , BAP1, BARD1, BLM, BMPR1A, BRCA1, BRIP1, CASR, CDC73, CDH1, CDK4, CDKN1B, CDKN1C, CDKN2A, CEBPA, CHEK2, DICER1, DIS3L2, EGFR, EPCAM, FH, FLCN, GATA2, GPC3, GREM1, HOXB13, HRAS,  KIT, MAX, MEN1, MET, MITF, MLH1, MSH2, MSH3, MSH6, MUTYH, NBN, NF1, NF2, NTHL1, PALB2, PDGFRA, PHOX2B, PMS2, POLD1, POLE, POT1, PRKAR1A, PTCH1, PTEN, RAD50, RAD51C, RAD51D, RB1, RECQL4, RET, RUNX1, SDHA, SDHAF2, SDHB, SDHC, SDHD, SMAD4, SMARCA4, SMARCB1, SMARCE1, STK11, SUFU, TERC, TERT, TMEM127, TP53, TSC1, TSC2, VHL, WRN, and WT1  genes did not have any obvious  mutations.  No uncertain variants were found.       Gynecologic History: Age of menarche: 11-12. Last menstrual period: 12/2021, s/p TH/BSO. Hormone-based contraception: IUD history x5 years . Fertility treatments: reports history of multiple rounds of IUI . Hormone replacement therapy: denies. Patient is G3 P2. Age of first live birth was 25. Patient did breast feed for 6-7 months total. Patient is sexually active with female partner(s).   Other Risk Factors: History of RT: denies Ashkenazi Jewish Ancestry: denies   Cancer-Related Family History:  Father, prostate cancer at 3; metastatic cancer; BRCA2 mutation carrier; deceased  Paternal uncle Reinaldo), prostate cancer in his late 86s (metastatic), died at 67 from cancer in May 08, 2021; BRCA2 mutation carrier (1st identified in family) Paternal aunt Irvin), cervical cancer; BRCA2 mutation carrier Paternal grandfather, prostate cancer in his late 59s; died at 51 from MI Paternal great aunt (grandmother's sister), breast cancer Mother, non-melanoma skin cancer Maternal grandmother, vaginal skin cancer at 35  PAST MEDICAL HISTORY Past Medical History:  Diagnosis Date  . Anxiety 01/21/2013   Not extreme. Xanax (limited dosage) is occasionally used to help with sleep  . Chronic insomnia 12/19/2011   Last Assessment & Plan: Formatting of this note might be different from the original. Takes occ ambien  or xanax px by gyn provider  . Depression 2004/2005   Post partum depression  . Endometriosis of uterus 2001/2010   Unsure of exact dates  . Genetic Test; BRCA2 Positive; Multi-Cancer Panel [facility] (2023) 08/24/2021  . History of abnormal cervical Pap smear 1996   Normal since resolved in 1996/1997  . History of endometriosis   . Infertility management 1999-2001  . PONV (postoperative nausea and vomiting)   .  Vitamin D deficiency    PAST SURGICAL HISTORY: Past Surgical History:  Procedure Laterality Date  . ROBOT ASSISTED LAPAROSCOPIC  SURGERY WITH TOTAL HYSTERECTOMY Bilateral 12/21/2021   Procedure: *ROBOT* ASSISTED LAPAROSCOPY, SURGICAL; WITH TOTAL HYSTERECTOMY, FOR UTERUS 250 G OR LESS; WITH REMOVAL OF TUBE(S) AND / OR OVARY(S);  Surgeon: Eloy Maurilio BROCKS, MD;  Location: [facility] OR;  Service: Gynecology;  Laterality: Bilateral;  . PELVIC EXAMINATION UNDER ANESTHESIA N/A 12/21/2021   Procedure: PELVIC EXAMINATION UNDER ANESTHESIA (OTHER THAN LOCAL);  Surgeon: Eloy Maurilio BROCKS, MD;  Location: [facility] OR;  Service: Gynecology;  Laterality: N/A;  . SLING FOR STRESS INCONTINENCE N/A 12/21/2021   Procedure: SLING OPERATION FOR STRESS INCONTINENCE (EG, FASCIA OR SYNTHETIC);  Surgeon: Erik Curtistine Roers, MD;  Location: [facility] OR;  Service: Gynecology;  Laterality: N/A;  . CYSTOURETHROSCOPY N/A 12/21/2021   Procedure: CYSTOURETHROSCOPY (SEPARATE PROCEDURE);  Surgeon: Erik Curtistine Roers, MD;  Location: [facility] OR;  Service: Gynecology;  Laterality: N/A;  . MASTOPEXY BILATERAL Bilateral 05/05/2022   Procedure: BILATERAL MASTOPEXY;  Surgeon: Orlando Hosey Ned, MD;  [facility];  Service: Plastic Surgery;  Laterality: Bilateral;  . ESOPHAGOGASTRODOUDENOSCOPY W/BIOPSY N/A 07/07/2022   Procedure: ESOPHAGOGASTRODUODENOSCOPY, FLEXIBLE, TRANSORAL; WITH BIOPSY, SINGLE OR MULTIPLE;  Surgeon: Jama Fonda Ruse, MD;  Location: [facility];  Service: Gastroenterology;  Laterality: N/A;  . MASTECTOMY BILATERAL SIMPLE Bilateral 03/27/2023   Procedure: (RCC) BILATERAL MASTECTOMY, SIMPLE, COMPLETE;  Surgeon: Dora Delon Murray, MD;  Location: [facility];  Service: General Surgery;  Laterality: Bilateral;  . RECONSTRUCTION BREAST W/TISSUE EXPANDER Bilateral 03/27/2023   Procedure: Bilateral TISSUE EXPANDER PLACEMENT IN BREAST RECONSTRUCTION, INCLUDING SUBSEQUENT EXPANSION(S);  Surgeon: Orlando Hosey Ned, MD;  Location: [facility];  Service: Plastic Surgery;  Laterality: Bilateral;  . IMPLANTATION BIOLOGIC IMPLANT  Bilateral 03/27/2023   Procedure: Bilateral IMPLANTATION OF BIOLOGIC IMPLANT (EG, ACELLULAR DERMAL MATRIX) FORSOFT TISSUE REINFORCEMENT (IE, BREAST, TRUNK) (F);  Surgeon: Orlando Hosey Ned, MD;  [facility];  Service: Plastic Surgery;  Laterality: Bilateral;  . INTRAVENOUS INJECTION TO TEST BLOOD FLOW IN FLAP/GRAFT Bilateral 03/27/2023   Procedure: INTRAVENOUS INJECTION OF AGENT (EG, FLUORESCEIN) TO TEST VASCULAR FLOW IN FLAP OR GRAFT;  Surgeon: Orlando Hosey Ned, MD;  [facility];  Service: Plastic Surgery;  Laterality: Bilateral;  . DEBRIDEMENT CHEST Left 05/02/2023   Procedure: ADULT, DEBRIDEMENT, CHEST; SUBCUTANEOUS TISSUE (INCLUDES EPIDERMIS AND DERMIS, IF PERFORMED); FIRST 20 SQ CM OR LESS;  Surgeon: Orlando Hosey Ned, MD;  Location: [facility] OR;  Service: Plastic Surgery;  Laterality: Left;  . RECONSTRUCTION BREAST W/TISSUE EXPANDER Left 05/02/2023   Procedure: TISSUE EXPANDER PLACEMENT IN BREAST RECONSTRUCTION, INCLUDING SUBSEQUENT EXPANSION(S);  Surgeon: Orlando Hosey Ned, MD;  Location: [facility] OR;  Service: Plastic Surgery;  Laterality: Left;  . CAPSULECTOMY BREAST PERIPROSTHETIC Left 05/02/2023   Procedure: PERI-IMPLANT CAPSULECTOMY, BREAST, COMPLETE, INCLUDING REMOVAL OF ALL INTRACAPSULAR CONTENTS;  Surgeon: Orlando Hosey Ned, MD;  Location: [facility] OR;  Service: Plastic Surgery;  Laterality: Left;  . REPLACEMENT TISSUE EXPANDER W/PERMANENT PROSTHESIS Bilateral 06/27/2023   Procedure: Bilateral REPLACEMENT OF TISSUE EXPANDER WITH PERMANENT IMPLANT;  Surgeon: Orlando Hosey Ned, MD;  [facility];  Service: Plastic Surgery;  Laterality: Bilateral;  . CAPSULECTOMY BREAST PERIPROSTHETIC BILATERAL Bilateral 06/27/2023   Procedure: BILATERAL REVISION OF PERI-IMPLANT CAPSULE, BREAST, INCLUDING CAPSULOTOMY, CAPSULORRHAPHY, AND/OR PARTIAL CAPSULECTOMY;  Surgeon: Orlando Hosey Ned, MD;  Location: [facility];  Service: Plastic Surgery;  Laterality:  Bilateral;  . BREAST SURGERY  3/25   Mastectomy  .  CERVICAL BIOPSY  W/ LOOP ELECTRODE EXCISION  1996/1997  . COLONOSCOPY    . DIAGNOSTIC LAPAROSCOPY     For diagnosis of endometriosis  . DILATION AND CURETTAGE OF UTERUS  1995  . ENDOMETRIAL ABLATION     Unsure if this was done due to endometriosis  . HYSTERECTOMY  12/23  . INDUCED ABORTION BY DILATION & CURETTAGE    . TUBAL LIGATION Bilateral   . Wisdom Teeth Extraction     SOCIAL HISTORY: Social History   Socioeconomic History  . Marital status: Married    Spouse name: Islay Polanco  . Number of children: 2  . Highest education level: Bachelor's degree (e.g., BA, AB, BS)  Occupational History  . Occupation: Landscape architect for [employer]  Tobacco Use  . Smoking status: Never  . Smokeless tobacco: Never  Vaping Use  . Vaping status: Never Used  Substance and Sexual Activity  . Alcohol use: Yes    Alcohol/week: 2.0 standard drinks of alcohol    Types: 2 Glasses of wine per week  . Drug use: No  . Sexual activity: Yes    Partners: Male    Birth control/protection: Surgical    Comment: Tubal ligation  Other Topics Concern  . Would you please tell us  about the people who live in your home, your pets, or anything else important to your social life? Yes    Comment: Husband, 61 year old son, 47 year old son (college), dog  Social History Narrative   *Last updated 09/02/2021   Preferred pronouns: She/Her/Hers   Married, 2 sons (ages 31y and 90y).   Education: Energy Manager   Occupation: Landscape architect for [employer]   Religious preference: Christian   Exercise: Peloton (although not recently)   Diet: No restrictions    Social Drivers of Corporate Investment Banker Strain: Low Risk  (11/01/2023)   Overall Financial Resource Strain (CARDIA)   . Difficulty of Paying Living Expenses: Not hard at all  Food Insecurity: No Food Insecurity (11/01/2023)   Hunger Vital Sign   . Worried About Patent Examiner in the Last Year: Never true   . Ran Out of Food in the Last Year: Never true  Transportation Needs: No Transportation Needs (11/01/2023)   PRAPARE - Transportation   . Lack of Transportation (Medical): No   . Lack of Transportation (Non-Medical): No   FAMILY HISTORY: Family History  Problem Relation Name Age of Onset  . Skin cancer Mother Joen Shoulder        non melanoma  . Prostate cancer Father Zell Shoulder 82  . Heart disease Paternal Grandmother June Ramsey   . Prostate cancer Paternal Grandfather Octaviano Shoulder   . Heart disease Paternal Grandfather Octaviano Shoulder   . Mental illness Maternal Uncle Darrell Clair   . Prostate cancer Paternal Uncle Nat Ramsey 52  . Anesthesia problems Neg Hx    . Breast cancer Neg Hx    . Ovarian cancer Neg Hx     MEDICATIONS: Current Outpatient Medications:  .  buPROPion (WELLBUTRIN XL) 150 MG XL tablet, Take 1 tablet (150 mg total) by mouth once daily, Disp: 30 tablet, Rfl: 11 .  calcium carb/vit D3/minerals (CALCIUM-VITAMIN D ORAL), Take 2 tablets by mouth once daily, Disp: , Rfl:  .  COLLAGEN MISC, Use 1 Scoop once daily Supplement added to tea, Disp: , Rfl:  .  estradiol  (DOTTI ) patch 0.05 mg/24 hr, APPLY 1 PATCH TWICE A WEEK, Disp: 24 patch, Rfl: 3 .  estradioL  (ESTRACE ) 0.01 % (0.1 mg/gram) vaginal cream, Place 1 g vaginally as directed (apply around urethra and vaginal opening twice weekly), Disp: 42.5 g, Rfl: 3 .  FLUoxetine  (PROZAC ) 10 MG capsule, Take 10 mg by mouth every morning, Disp: , Rfl:  .  methenamine hippurate (HIPREX) 1 gram tablet, Take 1 tablet (1 g total) by mouth 2 (two) times daily for 180 days One tablet by mouth every 12 hours., Disp: 180 tablet, Rfl: 1 .  progesterone  (PROMETRIUM ) 100 MG capsule, Take 100 mg by mouth at bedtime, Disp: , Rfl:  .  zolpidem  (AMBIEN  CR) 12.5 MG CR tablet, Take 12.5 mg by mouth at bedtime as needed for Sleep, Disp: , Rfl: 1  ALLERGIES: No Known Allergies  REVIEW OF SYSTEMS: General:  no fevers, no chills, no change in appetite, no weight loss/gain, no hot flashes, no fatigue ? HEENT: no neck swelling, no neck stiffness, no hoarseness, no hearing loss ? Resp: no cough, no wheezing, no hemoptysis, no trouble breathing ? Cardiac: no chest pain/pressure, no palpitations? Gastrointestinal: no nausea, no vomiting, no diarrhea, no constipation, no bleeding ? Neurologic: no seizures, no headache, no weakness, no change in vision ? Lymph nodes: no enlarged lymph nodes ? Musculoskeletal: no back pain, no neck pain, no leg pain, no arm pain, no joint pain ? UroGyn: no hematuria, no dysuria, no vaginal dryness, no abnormal vaginal bleeding Hematologic: no bruising, no bleeding ? Psych: no change is sleep patterns, no trouble sleeping, no depression/anxiety, no mood swings Exposures: no sick contacts, no recent travel  ? ?   PHYSICAL EXAM  BP 121/85   Pulse 97   Temp 36.9 C (98.4 F) (Temporal)   Ht 166.4 cm (5' 5.5)   Wt 72.3 kg (159 lb 6.4 oz)   LMP 11/23/2021 (Approximate)   SpO2 98%   BMI 26.12 kg/m  Chaperone Offered, declined    General appearance alert, appears stated age, and cooperative  Psych Appropriate  Neurological Intact and nonfocal  Neck Supple without adenopathy  Musculoskeletal Gait: normal Lower extremities: no edema  Skin Inspection: no erythema or rash    Lymph Nodes Cervical, supraclavicular, and axillary nodes normal.  Breasts Asymmetric (L >R) Bra size: 36C  Right Breast examined in upright and supine position Mastectomy site well healed with intact implant; no suspicious masses, skin or nipple changes or axillary nodes   Left Breast examined in upright and supine position Mastectomy site well healed with intact implant; no suspicious masses, skin or nipple changes or axillary nodes    ASSESSMENT & DISCUSSION   This is a 53 y.o. female with a BRCA2 mutation, now s/p bilateral risk reducing nipple sparing mastectomies w/ implant based  reconstruction in 03/2023  The patient is without evidence of breast malignancy by history or exam.   PLAN   No routine screening imaging s/p bilateral mastectomies Recommend annual clinical breast exam, including in the Breast Clinic, return to clinic in 1 year (~11/2024) Advised to continue self exam of the breasts and to report to this office sooner should the patient note any areas of abnormality or concern  The patient expressed understanding of this plan, was able to ask questions, and was in agreement with the proposed strategy. The patient was given my contact information and was encouraged to call with any questions.  Attestation Statement:   I personally performed the service, non-incident to. (WP)  JACLYN MARIE WHITE, NP Breast Surgery

## 2023-12-03 MED ORDER — PREDNISONE 5 MG PO TABS: ORAL_TABLET | ORAL | 0 refills | Status: DC

## 2023-12-05 DIAGNOSIS — L508 Other urticaria: Secondary | ICD-10-CM | POA: Diagnosis not present

## 2023-12-05 DIAGNOSIS — J301 Allergic rhinitis due to pollen: Secondary | ICD-10-CM | POA: Diagnosis not present

## 2023-12-05 DIAGNOSIS — J305 Allergic rhinitis due to food: Secondary | ICD-10-CM | POA: Diagnosis not present

## 2023-12-24 ENCOUNTER — Encounter: Admitting: Family Medicine

## 2023-12-24 ENCOUNTER — Ambulatory Visit

## 2023-12-24 DIAGNOSIS — L508 Other urticaria: Secondary | ICD-10-CM

## 2023-12-24 MED ORDER — CLOBETASOL PROPIONATE 0.05 % EX OINT: TOPICAL_OINTMENT | CUTANEOUS | 5 refills | Status: AC

## 2023-12-24 MED ORDER — DUPIXENT 300 MG/2ML ~~LOC~~ SOAJ: 600.0000 mg | Freq: Once | SUBCUTANEOUS | 0 refills | Status: AC

## 2023-12-24 MED ORDER — DUPIXENT 300 MG/2ML ~~LOC~~ SOAJ: 300.0000 mg | SUBCUTANEOUS | 11 refills | Status: AC

## 2023-12-24 NOTE — Patient Instructions (Signed)

## 2023-12-24 NOTE — Progress Notes (Signed)
 Subjective   Elizabeth Sheppard is a 53 y.o. female who presents for the following: Follow up of rash. Patient is established patient   Today patient reports: Patient here for chronic spontaneous urticaria f/u, discussing other treatment options. Patient did see allergist and all testing came back negative. Prednisone  course helped a lot, but flared again after the taper went to one pill a day. Patient wanting to discuss testing for alpha-gal allergy patient went beef free since Friday and noticed improvement.   Review of Systems:    No other skin or systemic complaints except as noted in HPI or Assessment and Plan.  The following portions of the chart were reviewed this encounter and updated as appropriate: medications, allergies, medical history  Relevant Medical History:  n/a   Objective  (SKPE) Well appearing patient in no apparent distress; mood and affect are within normal limits. Examination was performed of the: Focused Exam of: Bilateral arms, chest, bilateral legs, neck, face    Examination notable for: erythematous, scaly plaques of neck and hands  Examination limited by: Undergarments, Shoes or socks , and Clothing     Assessment & Plan  (SKAP)   Atopic dermatitis - moderate, BSA >10% - vs less likely chronic spontaneous urticaria  - Pt has tried and failed:. Oral steroids, topical steroids, tacrolimus , antihistamines.  Chronic and persistent condition with duration or expected duration over one year. Condition is symptomatic and bothersome to patient. Patient is flaring and not currently at treatment goal.   - Diagnosis, treatment options, prognosis, risk/ benefit, and side effects of treatment were discussed with the patient.  - Reviewed benign but chronic nature of disease. - Discussed dry skin care at length, recommended avoidance of fragrances, short showers with luke- warm water, no scrubbing, an unscented moisturizing soap (e.g. Dove sensitive skin) limited to  the groin and axillae, and frequent emollient use (Eucerin, Aquaphor, Cerave, Vanicream, Vaseline). - Discussed treatment with topical steroids, non steroidal topicals, systemics (dupixent , tralokinumab, nemolizumab, rinvoq)   - Cyclosporine, mycophenolate, azathioprine, methotrexate are older medications but can be considered  - Reviewed proper use of topical steroids to minimize the risk of steroid-induced skin changes.  - Also discussed appropriate dry skin care including daily warm baths with gentle soap, followed by liberal bland moisturizer application.  - A patient education handout reiterating this information was provided. - Given the moderate-severe atopic dermatitis with >10%  BSA and unresponsive to past treatment with above will pursue dupilumab . Atopic dermatitis is greatly impacting quality of life with intense pruritus, sleep disruption, moodiness   - Continue tacro ointments to face - Continue triam 0.1% ointment to body  - Start clobetasol  ointment 0.05% twice daily to affected skin for up to two weeks then discontinue.  Discussed side effect of super potent topical steroids including atrophy, dyspigmentation, striae, telangectasia, folliculitis, loss of skin pigment, hair growth, tachyphylaxis, risk of systemic absorption with missuse.  - Start dupilumab  600mg /25mL for loading dose then 300mg /45mL every 2 weeks for maintenance dose. Loading and maintenance dose prescriptions sent to [facility].  - Patient advised to let us  know if she has issues with prescriptions and can always start on samples from office.  - ADULTS - 600mg  subcutaneously on day 0, then 300mg  every other week starting day 14 - Side effects reviewed at length: injection site reactions, nasopharyngitis/conjunctivitis/URI, headache, exacerbation of atopic dermatitis, parasitic infection (be careful when traveling or camping) - Discussed needs to have container for sharps and proper disposal - Patient  requesting alpha-gal blood testing.    Patient instructions (SKPI)   Procedures, orders, diagnosis for this visit:  CHRONIC URTICARIA   Related Procedures Alpha-Gal Panel  Chronic urticaria -     Alpha-Gal Panel  Other orders -     Dupixent ; Inject 300 mg into the skin every 14 (fourteen) days. Starting at day 15 for maintenance.  Dispense: 4 mL; Refill: 11 -     Clobetasol  Propionate; Apply 1 gram topically to affected area of skin twice daily. Stop once resolved and restart as needed for flares. Avoid use on face, armpits, groin unless otherwise indicated.  Dispense: 60 g; Refill: 5 -     Dupixent ; Inject 600 mg into the skin once for 1 dose. On day 1.  Dispense: 4 mL; Refill: 0    Return to clinic: Return for 6-8 weeks atopic dermatitis, dupixent  f/u .  I, Jacquelynn V. Wilfred, CMA, am acting as scribe for Lauraine JAYSON Kanaris, MD.  Documentation: I have reviewed the above documentation for accuracy and completeness, and I agree with the above.  Lauraine JAYSON Kanaris, MD

## 2023-12-27 ENCOUNTER — Telehealth: Payer: Self-pay

## 2023-12-27 ENCOUNTER — Ambulatory Visit: Payer: Self-pay

## 2023-12-27 LAB — ALPHA-GAL PANEL
Allergen Lamb IgE: <0.1 kU/L
Beef IgE: <0.1 kU/L
IgE (Immunoglobulin E), Serum: 15 [IU]/mL (ref 6–495)
O215-IgE Alpha-Gal: <0.1 kU/L
Pork IgE: <0.1 kU/L

## 2023-12-27 NOTE — Telephone Encounter (Signed)
 Patient returned Mollie's call about lab results. She had reviewed results on Mychart and seen where Alpha Gal is negative. She denied any question about results.   Patient went on to report that she got a phone call letting her know that she was denied approval for dupixent  and would be faxing [facility] paperwork regarding this and stated some other additional information was needed.

## 2023-12-27 NOTE — Progress Notes (Signed)
 LMTRC

## 2023-12-27 NOTE — Telephone Encounter (Signed)
 I have submitted clinical information needed to [employer] to continue with PA and appeal. aw

## 2023-12-31 ENCOUNTER — Telehealth: Payer: Self-pay

## 2023-12-31 NOTE — Telephone Encounter (Signed)
 Per Bed bath & beyond requesting baseline evaluation using EASI, ISGA, POEM, or SCORAD. Please advise.

## 2023-12-31 NOTE — Telephone Encounter (Signed)
 Atopic dermatitis - moderate, BSA >10%, ISGA 3 - vs less likely chronic spontaneous urticaria  - Pt has tried and failed:. Oral steroids, topical steroids, tacrolimus , antihistamines.  Chronic and persistent condition with duration or expected duration over one year. Condition is symptomatic and bothersome to patient. Patient is flaring and not currently at treatment goal.   - Diagnosis, treatment options, prognosis, risk/ benefit, and side effects of treatment were discussed with the patient.  - Reviewed benign but chronic nature of disease. - Discussed dry skin care at length, recommended avoidance of fragrances, short showers with luke- warm water, no scrubbing, an unscented moisturizing soap (e.g. Dove sensitive skin) limited to the groin and axillae, and frequent emollient use (Eucerin, Aquaphor, Cerave, Vanicream, Vaseline). - Discussed treatment with topical steroids, non steroidal topicals, systemics (dupixent , tralokinumab, nemolizumab, rinvoq)              - Cyclosporine, mycophenolate, azathioprine, methotrexate are older medications but can be considered  - Reviewed proper use of topical steroids to minimize the risk of steroid-induced skin changes.  - Also discussed appropriate dry skin care including daily warm baths with gentle soap, followed by liberal bland moisturizer application.  - A patient education handout reiterating this information was provided. - Given the moderate-severe atopic dermatitis with >10%  BSA and unresponsive to past treatment with above will pursue dupilumab . Atopic dermatitis is greatly impacting quality of life with intense pruritus, sleep disruption, moodiness   - Continue tacro ointments to face - Continue triam 0.1% ointment to body  - Start clobetasol  ointment 0.05% twice daily to affected skin for up to two weeks then discontinue.  Discussed side effect of super potent topical steroids including atrophy, dyspigmentation, striae, telangectasia,  folliculitis, loss of skin pigment, hair growth, tachyphylaxis, risk of systemic absorption with missuse.   - Start dupilumab  600mg /78mL for loading dose then 300mg /78mL every 2 weeks for maintenance dose. Loading and maintenance dose prescriptions sent to [facility].  - Patient advised to let us  know if she has issues with prescriptions and can always start on samples from office.  - ADULTS - 600mg  subcutaneously on day 0, then 300mg  every other week starting day 14 - Side effects reviewed at length: injection site reactions, nasopharyngitis/conjunctivitis/URI, headache, exacerbation of atopic dermatitis, parasitic infection (be careful when traveling or camping) - Discussed needs to have container for sharps and proper disposal - Patient requesting alpha-gal blood testing.

## 2024-01-01 NOTE — Telephone Encounter (Signed)
-----   Message from Lauraine Kanaris, MD sent at 12/27/2023 10:44 AM EST ----- Please notify patient with below plan: Negative alpha gal panel

## 2024-01-01 NOTE — Telephone Encounter (Signed)
 Patient informed of results.

## 2024-01-02 ENCOUNTER — Encounter: Payer: Self-pay | Admitting: Family Medicine

## 2024-01-02 ENCOUNTER — Ambulatory Visit: Admitting: Family Medicine

## 2024-01-02 VITALS — BP 114/80 | HR 64 | Temp 99.1°F | Ht 65.25 in | Wt 164.5 lb

## 2024-01-02 DIAGNOSIS — Z1509 Genetic susceptibility to other malignant neoplasm: Secondary | ICD-10-CM

## 2024-01-02 DIAGNOSIS — F5101 Primary insomnia: Secondary | ICD-10-CM | POA: Diagnosis not present

## 2024-01-02 DIAGNOSIS — Z1501 Genetic susceptibility to malignant neoplasm of breast: Secondary | ICD-10-CM | POA: Diagnosis not present

## 2024-01-02 DIAGNOSIS — F4329 Adjustment disorder with other symptoms: Secondary | ICD-10-CM | POA: Diagnosis not present

## 2024-01-02 DIAGNOSIS — Z0000 Encounter for general adult medical examination without abnormal findings: Secondary | ICD-10-CM | POA: Diagnosis not present

## 2024-01-02 DIAGNOSIS — E663 Overweight: Secondary | ICD-10-CM | POA: Insufficient documentation

## 2024-01-02 DIAGNOSIS — Z79890 Hormone replacement therapy (postmenopausal): Secondary | ICD-10-CM

## 2024-01-02 DIAGNOSIS — Z1589 Genetic susceptibility to other disease: Secondary | ICD-10-CM | POA: Diagnosis not present

## 2024-01-02 DIAGNOSIS — Z23 Encounter for immunization: Secondary | ICD-10-CM

## 2024-01-02 DIAGNOSIS — Z1507 Genetic susceptibility to malignant neoplasm of urinary tract: Secondary | ICD-10-CM | POA: Diagnosis not present

## 2024-01-02 DIAGNOSIS — Z15068 Genetic susceptibility to other malignant neoplasm of digestive system: Secondary | ICD-10-CM

## 2024-01-02 DIAGNOSIS — Z15060 Genetic susceptibility to colorectal cancer: Secondary | ICD-10-CM | POA: Diagnosis not present

## 2024-01-02 DIAGNOSIS — Z1211 Encounter for screening for malignant neoplasm of colon: Secondary | ICD-10-CM

## 2024-01-02 NOTE — Assessment & Plan Note (Signed)
 S/p total hysterectomy  Using  Estrogen patch Estrace  vaginal cream Tried testosterone-not helpful  Stopped progesterone /not needed

## 2024-01-02 NOTE — Assessment & Plan Note (Signed)
 Pt is distressed about weight  Bmi under 30- cannot prescription glp-1 Pt wants to try wellbutrin again for appetite- may regulate appetite  Has binge eating behavior in afternoons  Offered referral for counseling if she thinks this is emotional eating

## 2024-01-02 NOTE — Assessment & Plan Note (Signed)
 Colonoscopy due for 7 year recall in 2029

## 2024-01-02 NOTE — Progress Notes (Unsigned)
 Subjective:    Patient ID: Elizabeth Sheppard, female    DOB: 1970-10-30, 53 y.o.   MRN: [id number]  HPI  Here for health maintenance exam and to review chronic medical problems   Wt Readings from Last 3 Encounters:  01/02/24 164 lb 8 oz (74.6 kg)  06/20/23 154 lb 6.4 oz (70 kg)  06/05/23 154 lb 6.4 oz (70 kg)   27.16 kg/m  Vitals:   01/02/24 1404  BP: 114/80  Pulse: 64  Temp: 99.1 F (37.3 C)  SpO2: 99%    Immunization History  Administered Date(s) Administered   H1N1 01/22/2008   Influenza Split 12/19/2011   Influenza, Seasonal, Injecte, Preservative Fre 12/06/2022, 01/02/2024   Influenza,inj,Quad PF,6+ Mos 01/21/2013, 10/12/2015, 09/26/2017   Moderna Sars-Covid-2 Vaccination 01/31/2019, 02/28/2019   Td 05/02/2006   Tdap 02/18/2014    Health Maintenance Due  Topic Date Due   HIV Screening  Never done   Hepatitis C Screening  Never done   Pneumococcal Vaccine: 50+ Years (1 of 2 - PCV) Never done   Hepatitis B Vaccines 19-59 Average Risk (1 of 3 - 19+ 3-dose series) Never done   Flu shot -today   Shingrix -declines   Mammogram 04/2022  Double mastectomy for [facility] gene    No more mammograms recommended  Self breast exam-no change  Has specialist breast exams   Gyn health HRT est and progesterone  - stopped progesterone  since sleep is ok and does not seem to need  Total hysterectomy for [facility] gene  Did bimanual pelvic last year  Does not need paps   Her doctor told her to do yearly    Colon cancer screening  Colonoscopy 07/2020 with 7 y recall   Bone health   Falls-none  Fractures-none  Supplements   Exercise  Reformer pilates  Good strength building    She is distressed by weight gain Took small dose of glp-1   Has has issues with intermittent hives  4 zyrtec daily for 3 months  Never found a cause     Mood    01/02/2024    2:08 PM 06/05/2023   11:15 AM 03/14/2023    8:23 AM 12/06/2022    3:05 PM 07/11/2022    3:59 PM  Depression  screen PHQ 2/9  Decreased Interest 0 0 2 0 0  Down, Depressed, Hopeless 0 0 2 0 0  PHQ - 2 Score 0 0 4 0 0  Altered sleeping 0 0 2 0   Tired, decreased energy 1 0 2 0   Change in appetite 2 0 0 0   Feeling bad or failure about yourself  1 0 0 0   Trouble concentrating 0 0 0 0   Moving slowly or fidgety/restless 0 0 0 0   Suicidal thoughts 0 0 0 0   PHQ-9 Score 4 0  8  0    Difficult doing work/chores Not difficult at all Not difficult at all Not difficult at all Not difficult at all      Data saved with a previous flowsheet row definition   Anxiety  Fluoxetine  10 mg daily- was able to come off of it  (it killed her libido)  Has wellbutrin -wants to try it again (mood and food noise)    Doing better since surgeries are done    Insomnia  Ambien  cr 12.5 mg - rarely needs it   Way less alcohol than in the past  3 glasses of wine per week instead of  8    Cholesterol Lab Results  Component Value Date   CHOL 167 01/02/2024   CHOL 170 11/20/2022   CHOL 140 04/28/2020   Lab Results  Component Value Date   HDL 67.10 01/02/2024   HDL 61.90 11/20/2022   HDL 50.80 04/28/2020   Lab Results  Component Value Date   LDLCALC 84 01/02/2024   LDLCALC 91 11/20/2022   LDLCALC 76 04/28/2020   Lab Results  Component Value Date   TRIG 79.0 01/02/2024   TRIG 87.0 11/20/2022   TRIG 66.0 04/28/2020   Lab Results  Component Value Date   CHOLHDL 2 01/02/2024   CHOLHDL 3 11/20/2022   CHOLHDL 3 04/28/2020   No results found for: LDLDIRECT   Lab Results  Component Value Date   NA 140 01/02/2024   K 4.3 01/02/2024   CO2 30 01/02/2024   GLUCOSE 74 01/02/2024   BUN 14 01/02/2024   CREATININE 0.63 01/02/2024   CALCIUM 9.5 01/02/2024   GFR 101.23 01/02/2024   GFRNONAA 120 01/22/2008   Lab Results  Component Value Date   ALT 31 01/02/2024   AST 28 01/02/2024   ALKPHOS 57 01/02/2024   BILITOT 0.3 01/02/2024    Lab Results  Component Value Date   WBC 4.5 01/02/2024    HGB 13.5 01/02/2024   HCT 39.7 01/02/2024   MCV 94.5 01/02/2024   PLT 323.0 01/02/2024   Lab Results  Component Value Date   TSH 2.74 11/20/2022    Labs at [facility] on oct  Normal B12, ferritin, thyroid  profile and A1c  Vit D level 32     Patient Active Problem List   Diagnosis Date Noted   Overweight (BMI 25.0-29.9) 01/02/2024   Hormone replacement therapy (HRT) 04/12/2023   Adjustment disorder with emotional disturbance 03/14/2023   Renal cyst 07/11/2022   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   Routine general medical examination at a health care facility 04/28/2020   Colon cancer screening 04/28/2020   Anxiety 01/21/2013   Insomnia 12/19/2011   Past Medical History:  Diagnosis Date   BRCA2 positive    Dysplastic nevus 07/09/2017   left upper flank   Past Surgical History:  Procedure Laterality Date   COLONOSCOPY WITH PROPOFOL  N/A 07/26/2020   Procedure: COLONOSCOPY WITH PROPOFOL ;  Surgeon: Therisa Bi, MD;  Location: [facility] ENDOSCOPY;  Service: Gastroenterology;  Laterality: N/A;   DIAGNOSTIC LAPAROSCOPY     LAPAROSCOPIC ENDOMETRIOSIS FULGURATION     TUBAL LIGATION     Social History[1] Family History  Problem Relation Age of Onset   Prostate cancer Father    Cancer Maternal Grandmother        skin cancer on the vagina   Cancer Paternal Grandfather        prostate   Allergies[2] Medications Ordered Prior to Encounter[3]  Review of Systems  Constitutional:  Positive for appetite change. Negative for activity change, fatigue, fever and unexpected weight change.  HENT:  Negative for congestion, ear pain, rhinorrhea, sinus pressure and sore throat.   Eyes:  Negative for pain, redness and visual disturbance.  Respiratory:  Negative for cough, shortness of breath and wheezing.   Cardiovascular:  Negative for chest pain and palpitations.  Gastrointestinal:  Negative for abdominal pain, blood in stool, constipation and diarrhea.   Endocrine: Negative for polydipsia and polyuria.  Genitourinary:  Negative for dysuria, frequency and urgency.  Musculoskeletal:  Negative for arthralgias, back pain and myalgias.  Skin:  Negative for pallor  and rash.  Allergic/Immunologic: Negative for environmental allergies.  Neurological:  Negative for dizziness, syncope and headaches.  Hematological:  Negative for adenopathy. Does not bruise/bleed easily.  Psychiatric/Behavioral:  Positive for dysphoric mood. Negative for decreased concentration. The patient is nervous/anxious.        Mood is up and down  Pretty good today       Objective:   Physical Exam Constitutional:      General: She is not in acute distress.    Appearance: Normal appearance. She is well-developed and normal weight. She is not ill-appearing or diaphoretic.  HENT:     Head: Normocephalic and atraumatic.     Right Ear: Tympanic membrane, ear canal and external ear normal.     Left Ear: Tympanic membrane, ear canal and external ear normal.     Nose: Nose normal. No congestion.     Mouth/Throat:     Mouth: Mucous membranes are moist.     Pharynx: Oropharynx is clear. No posterior oropharyngeal erythema.  Eyes:     General: No scleral icterus.    Extraocular Movements: Extraocular movements intact.     Conjunctiva/sclera: Conjunctivae normal.     Pupils: Pupils are equal, round, and reactive to light.  Neck:     Thyroid : No thyromegaly.     Vascular: No carotid bruit or JVD.  Cardiovascular:     Rate and Rhythm: Normal rate and regular rhythm.     Pulses: Normal pulses.     Heart sounds: Normal heart sounds.     No gallop.  Pulmonary:     Effort: Pulmonary effort is normal. No respiratory distress.     Breath sounds: Normal breath sounds. No wheezing.     Comments: Good air exch Chest:     Chest wall: No tenderness.  Abdominal:     General: Bowel sounds are normal. There is no distension or abdominal bruit.     Palpations: Abdomen is soft. There  is no mass.     Tenderness: There is no abdominal tenderness.     Hernia: No hernia is present.  Genitourinary:    Comments: Breast and pelvic exam are done by specialty  provider (mastectomy with reconstruction)               Anus appears normal w/o hemorrhoids or masses       External genitalia : nl appearance and hair distribution/no lesions       Urethral meatus : nl size, no lesions or prolapse       Urethra: no masses, tenderness or scarring      Bladder : no masses or tenderness       Vagina: nl general appearance, no discharge or  Lesions, no significant cystocele  or rectocele   (normal appearing vaginal mucosa and vaginal cuff)    Cervix and uterus absent      Musculoskeletal:        General: No tenderness. Normal range of motion.     Cervical back: Normal range of motion and neck supple. No rigidity. No muscular tenderness.     Right lower leg: No edema.     Left lower leg: No edema.     Comments: No kyphosis   Lymphadenopathy:     Cervical: No cervical adenopathy.  Skin:    General: Skin is warm and dry.     Coloration: Skin is not pale.     Findings: No erythema or rash.     Comments: Solar lentigines diffusely  Neurological:     Mental Status: She is alert. Mental status is at baseline.     Cranial Nerves: No cranial nerve deficit.     Motor: No abnormal muscle tone.     Coordination: Coordination normal.     Gait: Gait normal.     Deep Tendon Reflexes: Reflexes are normal and symmetric. Reflexes normal.  Psychiatric:        Mood and Affect: Mood normal.        Cognition and Memory: Cognition and memory normal.           Assessment & Plan:   Problem List Items Addressed This Visit       Other   Routine general medical examination at a health care facility - Primary   Reviewed health habits including diet and exercise and skin cancer prevention Reviewed appropriate screening tests for age  Also reviewed health mt list, fam hx and immunization  status , as well as social and family history   See HPI Labs reviewed and ordered Health Maintenance  Topic Date Due   HIV Screening  Never done   Hepatitis C Screening  Never done   Pneumococcal Vaccine for age over 86 (1 of 2 - PCV) Never done   Hepatitis B Vaccine (1 of 3 - 19+ 3-dose series) Never done   COVID-19 Vaccine (3 - Moderna risk series) 03/29/2024*   Zoster (Shingles) Vaccine (1 of 2) 04/01/2025*   DTaP/Tdap/Td vaccine (3 - Td or Tdap) 02/19/2024   Colon Cancer Screening  07/27/2027   Flu Shot  Completed   HPV Vaccine  Aged Out   Meningitis B Vaccine  Aged Out   Breast Cancer Screening  Discontinued  *Topic was postponed. The date shown is not the original due date.    Flu shot today  Declines covid and shingrix vaccines  No longer gets mammograms -s/p mastectomy for [facility] Pelvic exam normal (no pap-had hysterectomy) Discussed fall prevention, supplements and exercise for bone density   Would like to get DEXA if covered-will find that out and message us  to order  No falls or fractures  Great exercise  PHQ 0 but still struggles with mood s/p her surgeries  Considering re trial of wellbutrin  Has cut alcohol intake Is distressed abou there weight  Wellness labs today      Relevant Orders   Lipid panel (Completed)   Comprehensive metabolic panel with GFR (Completed)   CBC with Differential/Platelet (Completed)   Overweight (BMI 25.0-29.9)   Pt is distressed about weight  Bmi under 30- cannot prescription glp-1 Pt wants to try wellbutrin again for appetite- may regulate appetite  Has binge eating behavior in afternoons  Offered referral for counseling if she thinks this is emotional eating       Insomnia   Occational ambien  12.5 mg prn Not needing often      Hormone replacement therapy (HRT)   S/p total hysterectomy  Using  Estrogen patch Estrace  vaginal cream Tried testosterone-not helpful  Stopped progesterone /not needed        Colon cancer  screening   Colonoscopy due for 7 year recall in 2029      BRCA gene mutation positive   S/p hysterectomy and mastectomy Continues specialty care  Normal pelvic exam today      Adjustment disorder with emotional disturbance   Pt overall has adjusted well after surgeries Still dealing with some ups and downs  Reviewed stressors/ coping techniques/symptoms/ support sources/ tx options and side effects  in detail today Frustrated over weight gain Interested in trying wellbutrin again and will update  Encouraged to keep up great self care       Other Visit Diagnoses       Need for influenza vaccination       Relevant Orders   Flu vaccine trivalent PF, 6mos and older(Flulaval,Afluria,Fluarix,Fluzone) (Completed)         [1]  Social History Tobacco Use   Smoking status: Never   Smokeless tobacco: Never  Vaping Use   Vaping status: Never Used  Substance Use Topics   Alcohol use: Yes    Comment: occasional wine   Drug use: No  [2] No Known Allergies [3]  Current Outpatient Medications on File Prior to Visit  Medication Sig Dispense Refill   clobetasol  ointment (TEMOVATE ) 0.05 % Apply 1 gram topically to affected area of skin twice daily. Stop once resolved and restart as needed for flares. Avoid use on face, armpits, groin unless otherwise indicated. 60 g 5   conjugated estrogens (PREMARIN) vaginal cream Place 1 applicator vaginally 2 (two) times a week.     Dupilumab  (DUPIXENT ) 300 MG/2ML SOAJ Inject 300 mg into the skin every 14 (fourteen) days. Starting at day 15 for maintenance. 4 mL 11   estradiol  (VIVELLE -DOT) 0.05 MG/24HR patch Place 1 patch (0.05 mg total) onto the skin 2 (two) times a week. 24 patch 3   tacrolimus  (PROTOPIC ) 0.1 % ointment Apply 2 grams twice daily to affected areas of skin 100 g 5   triamcinolone  ointment (KENALOG ) 0.1 % Apply 1 gram twice daily to affected areas of skin. Stop once resolved and restart as needed for flares. Avoid use on face,  armpits, groin unless otherwise indicated. 30 g 5   zolpidem  (AMBIEN  CR) 12.5 MG CR tablet Take 1 tablet (12.5 mg total) by mouth at bedtime as needed. for sleep 30 tablet 1   No current facility-administered medications on file prior to visit.

## 2024-01-02 NOTE — Assessment & Plan Note (Signed)
 Occational ambien  12.5 mg prn Not needing often

## 2024-01-02 NOTE — Patient Instructions (Addendum)
 Check with your insurance co to see if bone density testing is covered   Wellbutrin xl 150- consider starting back to see if it helps with appetite /food drive and mood   Keep up the great job with exercise   Eat a balanced healthy diet with lots of lean protein   Avoid added sugars in your diet when you can  Try to get most of your carbohydrates from produce (with the exception of white potatoes) and whole grains Eat less bread/pasta/rice/snack foods/cereals/sweets and other items from the middle of the grocery store (processed carbs)  Lab today  Flu shot today

## 2024-01-02 NOTE — Assessment & Plan Note (Signed)
 Reviewed health habits including diet and exercise and skin cancer prevention Reviewed appropriate screening tests for age  Also reviewed health mt list, fam hx and immunization status , as well as social and family history   See HPI Labs reviewed and ordered Health Maintenance  Topic Date Due   HIV Screening  Never done   Hepatitis C Screening  Never done   Pneumococcal Vaccine for age over 31 (1 of 2 - PCV) Never done   Hepatitis B Vaccine (1 of 3 - 19+ 3-dose series) Never done   COVID-19 Vaccine (3 - Moderna risk series) 03/29/2024*   Zoster (Shingles) Vaccine (1 of 2) 04/01/2025*   DTaP/Tdap/Td vaccine (3 - Td or Tdap) 02/19/2024   Colon Cancer Screening  07/27/2027   Flu Shot  Completed   HPV Vaccine  Aged Out   Meningitis B Vaccine  Aged Out   Breast Cancer Screening  Discontinued  *Topic was postponed. The date shown is not the original due date.    Flu shot today  Declines covid and shingrix vaccines  No longer gets mammograms -s/p mastectomy for [facility] Pelvic exam normal (no pap-had hysterectomy) Discussed fall prevention, supplements and exercise for bone density   Would like to get DEXA if covered-will find that out and message us  to order  No falls or fractures  Great exercise  PHQ 0 but still struggles with mood s/p her surgeries  Considering re trial of wellbutrin  Has cut alcohol intake Is distressed abou there weight  Wellness labs today

## 2024-01-03 ENCOUNTER — Telehealth: Payer: Self-pay

## 2024-01-03 ENCOUNTER — Ambulatory Visit: Payer: Self-pay | Admitting: Family Medicine

## 2024-01-03 LAB — CBC WITH DIFFERENTIAL/PLATELET
Basophils Absolute: 0 K/uL (ref 0.0–0.1)
Basophils Relative: 0.8 % (ref 0.0–3.0)
Eosinophils Absolute: 0.1 K/uL (ref 0.0–0.7)
Eosinophils Relative: 2.8 % (ref 0.0–5.0)
HCT: 39.7 % (ref 36.0–46.0)
Hemoglobin: 13.5 g/dL (ref 12.0–15.0)
Lymphocytes Relative: 29.1 % (ref 12.0–46.0)
Lymphs Abs: 1.3 K/uL (ref 0.7–4.0)
MCHC: 34 g/dL (ref 30.0–36.0)
MCV: 94.5 fl (ref 78.0–100.0)
Monocytes Absolute: 0.4 K/uL (ref 0.1–1.0)
Monocytes Relative: 8.3 % (ref 3.0–12.0)
Neutro Abs: 2.7 K/uL (ref 1.4–7.7)
Neutrophils Relative %: 59 % (ref 43.0–77.0)
Platelets: 323 K/uL (ref 150.0–400.0)
RBC: 4.2 Mil/uL (ref 3.87–5.11)
RDW: 13.1 % (ref 11.5–15.5)
WBC: 4.5 K/uL (ref 4.0–10.5)

## 2024-01-03 LAB — LIPID PANEL
Cholesterol: 167 mg/dL (ref 28–200)
HDL: 67.1 mg/dL (ref >39.00)
LDL Cholesterol: 84 mg/dL (ref 10–99)
NonHDL: 99.63
Total CHOL/HDL Ratio: 2
Triglycerides: 79 mg/dL (ref 10.0–149.0)
VLDL: 15.8 mg/dL (ref 0.0–40.0)

## 2024-01-03 LAB — COMPREHENSIVE METABOLIC PANEL WITH GFR
ALT: 31 U/L (ref 3–35)
AST: 28 U/L (ref 5–37)
Albumin: 4.5 g/dL (ref 3.5–5.2)
Alkaline Phosphatase: 57 U/L (ref 39–117)
BUN: 14 mg/dL (ref 6–23)
CO2: 30 meq/L (ref 19–32)
Calcium: 9.5 mg/dL (ref 8.4–10.5)
Chloride: 104 meq/L (ref 96–112)
Creatinine, Ser: 0.63 mg/dL (ref 0.40–1.20)
GFR: 101.23 mL/min (ref >60.00)
Glucose, Bld: 74 mg/dL (ref 70–99)
Potassium: 4.3 meq/L (ref 3.5–5.1)
Sodium: 140 meq/L (ref 135–145)
Total Bilirubin: 0.3 mg/dL (ref 0.2–1.2)
Total Protein: 7.3 g/dL (ref 6.0–8.3)

## 2024-01-03 NOTE — Telephone Encounter (Signed)
 Insurance is asking for one of the following scoring tool to be used for Dupixent  PA:  EASI, ISGA, POEM or SCORAD score.   Please advise.   Thank you!

## 2024-01-03 NOTE — Assessment & Plan Note (Signed)
 S/p hysterectomy and mastectomy Continues specialty care  Normal pelvic exam today

## 2024-01-03 NOTE — Assessment & Plan Note (Signed)
 Pt overall has adjusted well after surgeries Still dealing with some ups and downs  Reviewed stressors/ coping techniques/symptoms/ support sources/ tx options and side effects in detail today Frustrated over weight gain Interested in trying wellbutrin again and will update  Encouraged to keep up great self care

## 2024-01-23 ENCOUNTER — Encounter: Admitting: Family Medicine

## 2024-02-18 ENCOUNTER — Ambulatory Visit

## 2024-03-05 ENCOUNTER — Ambulatory Visit

## 2024-06-10 ENCOUNTER — Ambulatory Visit: Admitting: Dermatology
